# Patient Record
Sex: Female | Born: 1955 | Race: White | Hispanic: No | Marital: Married | Smoking: Never smoker
Health system: Southern US, Community
[De-identification: ages and names within clinical notes are randomized; demographics above are authoritative.]

## PROBLEM LIST (undated history)

## (undated) DIAGNOSIS — I639 Cerebral infarction, unspecified: Secondary | ICD-10-CM

## (undated) DIAGNOSIS — M1990 Unspecified osteoarthritis, unspecified site: Secondary | ICD-10-CM

## (undated) DIAGNOSIS — B269 Mumps without complication: Secondary | ICD-10-CM

## (undated) DIAGNOSIS — B019 Varicella without complication: Secondary | ICD-10-CM

## (undated) DIAGNOSIS — B059 Measles without complication: Secondary | ICD-10-CM

## (undated) DIAGNOSIS — T7840XA Allergy, unspecified, initial encounter: Secondary | ICD-10-CM

## (undated) HISTORY — DX: Mumps without complication: B26.9

## (undated) HISTORY — DX: Varicella without complication: B01.9

## (undated) HISTORY — PX: TUBAL LIGATION: SHX77

## (undated) HISTORY — DX: Allergy, unspecified, initial encounter: T78.40XA

## (undated) HISTORY — DX: Measles without complication: B05.9

---

## 2014-08-17 ENCOUNTER — Observation Stay: Payer: Self-pay | Admitting: Internal Medicine

## 2014-08-17 DIAGNOSIS — I119 Hypertensive heart disease without heart failure: Secondary | ICD-10-CM | POA: Insufficient documentation

## 2014-08-17 DIAGNOSIS — I639 Cerebral infarction, unspecified: Secondary | ICD-10-CM

## 2014-08-17 DIAGNOSIS — Z8673 Personal history of transient ischemic attack (TIA), and cerebral infarction without residual deficits: Secondary | ICD-10-CM | POA: Insufficient documentation

## 2014-08-17 HISTORY — DX: Cerebral infarction, unspecified: I63.9

## 2014-08-17 LAB — APTT: ACTIVATED PTT: 25.3 s (ref 23.6–35.9)

## 2014-08-17 LAB — URINALYSIS, COMPLETE
Bacteria: NONE SEEN
Bilirubin,UR: NEGATIVE
Blood: NEGATIVE
Glucose,UR: NEGATIVE mg/dL (ref 0–75)
Ketone: NEGATIVE
Leukocyte Esterase: NEGATIVE
NITRITE: NEGATIVE
Ph: 6 (ref 4.5–8.0)
Protein: NEGATIVE
RBC,UR: <1 /HPF (ref 0–5)
SQUAMOUS EPITHELIAL: NONE SEEN
Specific Gravity: 1.013 (ref 1.003–1.030)
WBC UR: <1 /HPF (ref 0–5)

## 2014-08-17 LAB — COMPREHENSIVE METABOLIC PANEL
ALK PHOS: 116 U/L
Albumin: 4 g/dL (ref 3.4–5.0)
Anion Gap: 7 (ref 7–16)
BUN: 15 mg/dL (ref 7–18)
Bilirubin,Total: 0.3 mg/dL (ref 0.2–1.0)
CHLORIDE: 106 mmol/L (ref 98–107)
CREATININE: 1.04 mg/dL (ref 0.60–1.30)
Calcium, Total: 9 mg/dL (ref 8.5–10.1)
Co2: 25 mmol/L (ref 21–32)
EGFR (Non-African Amer.): 59 — ABNORMAL LOW
Glucose: 131 mg/dL — ABNORMAL HIGH (ref 65–99)
Osmolality: 278 (ref 275–301)
POTASSIUM: 4 mmol/L (ref 3.5–5.1)
SGOT(AST): 25 U/L (ref 15–37)
SGPT (ALT): 35 U/L
Sodium: 138 mmol/L (ref 136–145)
TOTAL PROTEIN: 7.5 g/dL (ref 6.4–8.2)

## 2014-08-17 LAB — PROTIME-INR
INR: 0.9
Prothrombin Time: 12.3 secs (ref 11.5–14.7)

## 2014-08-17 LAB — CBC
HCT: 45.2 % (ref 35.0–47.0)
HGB: 14.8 g/dL (ref 12.0–16.0)
MCH: 28 pg (ref 26.0–34.0)
MCHC: 32.6 g/dL (ref 32.0–36.0)
MCV: 86 fL (ref 80–100)
Platelet: 253 10*3/uL (ref 150–440)
RBC: 5.28 10*6/uL — ABNORMAL HIGH (ref 3.80–5.20)
RDW: 13 % (ref 11.5–14.5)
WBC: 5.3 10*3/uL (ref 3.6–11.0)

## 2014-08-17 LAB — TROPONIN I: Troponin-I: <0.02 ng/mL

## 2014-08-17 LAB — HCG, QUANTITATIVE, PREGNANCY: Beta Hcg, Quant.: 2 m[IU]/mL

## 2014-08-18 LAB — BASIC METABOLIC PANEL
Anion Gap: 7 (ref 7–16)
BUN: 14 mg/dL (ref 7–18)
CALCIUM: 8.5 mg/dL (ref 8.5–10.1)
CHLORIDE: 109 mmol/L — AB (ref 98–107)
CREATININE: 0.87 mg/dL (ref 0.60–1.30)
Co2: 27 mmol/L (ref 21–32)
GLUCOSE: 94 mg/dL (ref 65–99)
Osmolality: 285 (ref 275–301)
Potassium: 4 mmol/L (ref 3.5–5.1)
Sodium: 143 mmol/L (ref 136–145)

## 2014-08-18 LAB — CBC WITH DIFFERENTIAL/PLATELET
BASOS ABS: 0 10*3/uL (ref 0.0–0.1)
Basophil %: 0.6 %
EOS ABS: 0.1 10*3/uL (ref 0.0–0.7)
EOS PCT: 2 %
HCT: 42.7 % (ref 35.0–47.0)
HGB: 14.4 g/dL (ref 12.0–16.0)
LYMPHS ABS: 2.2 10*3/uL (ref 1.0–3.6)
Lymphocyte %: 30.1 %
MCH: 28.6 pg (ref 26.0–34.0)
MCHC: 33.8 g/dL (ref 32.0–36.0)
MCV: 85 fL (ref 80–100)
MONOS PCT: 7.3 %
Monocyte #: 0.5 x10 3/mm (ref 0.2–0.9)
Neutrophil #: 4.5 10*3/uL (ref 1.4–6.5)
Neutrophil %: 60 %
Platelet: 265 10*3/uL (ref 150–440)
RBC: 5.04 10*6/uL (ref 3.80–5.20)
RDW: 13.4 % (ref 11.5–14.5)
WBC: 7.4 10*3/uL (ref 3.6–11.0)

## 2014-08-18 LAB — LIPID PANEL
Cholesterol: 208 mg/dL — ABNORMAL HIGH (ref 0–200)
HDL Cholesterol: 70 mg/dL — ABNORMAL HIGH (ref 40–60)
LDL CHOLESTEROL, CALC: 123 mg/dL — AB (ref 0–100)
Triglycerides: 74 mg/dL (ref 0–200)
VLDL CHOLESTEROL, CALC: 15 mg/dL (ref 5–40)

## 2014-08-18 LAB — HEMOGLOBIN A1C: Hemoglobin A1C: 5.7 % (ref 4.2–6.3)

## 2014-09-20 LAB — LIPID PANEL
Cholesterol: 197 mg/dL (ref 0–200)
HDL: 84 mg/dL — AB (ref 35–70)
LDL CALC: 96 mg/dL
Triglycerides: 83 mg/dL (ref 40–160)

## 2014-09-20 LAB — HEPATIC FUNCTION PANEL: ALT: 22 U/L (ref 7–35)

## 2014-10-23 LAB — BASIC METABOLIC PANEL
BUN: 12 mg/dL (ref 4–21)
CREATININE: 0.8 mg/dL (ref 0.5–1.1)
GLUCOSE: 102 mg/dL
POTASSIUM: 4.8 mmol/L (ref 3.4–5.3)
Sodium: 141 mmol/L (ref 137–147)

## 2014-10-23 LAB — LIPID PANEL
Cholesterol: 199 mg/dL (ref 0–200)
HDL: 76 mg/dL — AB (ref 35–70)
LDL Cholesterol: 109 mg/dL
Triglycerides: 72 mg/dL (ref 40–160)

## 2014-10-23 LAB — HEPATIC FUNCTION PANEL: ALT: 14 U/L (ref 7–35)

## 2015-03-31 NOTE — Discharge Summary (Signed)
PATIENT NAME:  Valerie LimboHILLIPS, Keily M MR#:  [id number] DATE OF BIRTH:  [birth year]  DATE OF ADMISSION:  08/17/2014 DATE OF DISCHARGE:  08/18/2014  ADMISSION DIAGNOSIS: Transient ischemic attack.   DISCHARGE DIAGNOSES:  1. 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  2. Uncontrolled hypertension.   PERTINENT LABORATORIES AT DISCHARGE:  1. White blood cells 7.4, hemoglobin 14, hematocrit 43, platelets are 265,000. Sodium 143, potassium 4.0, chloride 109, bicarbonate 27, BUN 14, creatinine 0.87, glucose is 94.  2. Hemoglobin A1c is 5.7.  3. LDL is 123, cholesterol 208.  4. 2D echocardiogram showed impaired relaxation and LV diastolic dysfunction, EF 55%-60% with mildly increased left ventricular septal thickness and left ventricular hypertrophy.  5. Carotid Doppler showed less than 50% stenosis bilaterally.  6. MRI of the brain which showed an acute white matter infarct adjacent to the left lateral ventricle and minimal periventricular and subcortical white matter changes predominantly on the left.   HOSPITAL COURSE: A 59 year old female with uncontrolled hypertension not taking medications at home, who presented with right-sided weakness, found to have an acute stroke by MRI. For further details, please refer to H and P.     1. Acute CVA of the left ventricle. I suspect this is due to uncontrolled hypertension. She has microvascular changes on MRI. I do not think this is embolic in nature. Her symptoms have resolved. No evidence of emboli on echocardiogram, her carotid Dopplers were less than 50% stenosis. She will continue on aspirin and statin and have neurology followup in two weeks.  2. Hypertension, long-standing issue, uncontrolled as per the family.  She was started on metoprolol. She will need close followup as an outpatient. Since this is an acute stroke she may have some permissive hypertension for a few days after acute symptoms.   DISCHARGE MEDICATIONS:  1. Aspirin  81 mg daily.  2. Metoprolol 25  b.i.d.  3. Simvastatin 20 mg at bedtime.   DISCHARGE DIET: Low sodium, low fat, low cholesterol.   DISCHARGE ACTIVITY: As tolerated. No heavy lifting.    DISCHARGE FOLLOWUP:  The patient will find a primary care physician in the area and followup  is strongly encouraged and discussed with her and her family.   TIME SPENT: Approximately 35 minutes.   The patient is stable for discharge.    ____________________________ Demba Nigh P. Juliene PinaMody, MD spm:bu D: 08/18/2014 12:09:58 ET T: 08/18/2014 13:03:10 ET JOB#: [id number]  cc: Mordche Hedglin P. Juliene PinaMody, MD, <Dictator> Janyth ContesSITAL P Mario Coronado MD ELECTRONICALLY SIGNED 08/18/2014 15:43

## 2015-03-31 NOTE — H&P (Signed)
PATIENT NAME:  Valerie Henry, Valerie Henry MR#:  [id number] DATE OF BIRTH:  Dec 08, 1956  DATE OF ADMISSION:  08/17/2014  PRIMARY DOCTOR: From [facility].  EMERGENCY ROOM PHYSICIAN: Eryka A. Inocencio Homes, MD  CHIEF COMPLAINT: Right-sided weakness.  HISTORY OF PRESENT ILLNESS: A 59 year old female patient with no past medical problems, brought in because of sudden onset of right-sided weakness. The patient finished workout with her friend, then she noticed she was very weak on the right side. She did not have any power on the right side, and then she also felt numb. All the symptoms lasted for 1/2 hour.  sudden onset of right-sided weakness, started around 8:00 a.m., lasted for 30 minutes. The patient called EMS around 8:15. By the time she came to the ER, all her symptoms resolved. The patient found to have elevated blood pressure at 200/110, and it stayed persistently high, and during my visit, blood pressure is high at 181/112. The patient denies any complaints. She says she was slightly blurred when the weakness started on the right side. She noted blurring of the vision on the right side (>. All the symptoms resolved.  now.Denies any complaints now. CT head unremarkable, and her lab data unremarkable. The patient denies any dysphagia. Denies any dysarthria. Denies any trouble with ambulation, and the patient also denies any headache. We are admitting her for TIA evaluation.   PAST MEDICAL HISTORY: No hypertension or diabetes.   ALLERGIES: No known allergies.   SOCIAL HISTORY: No smoking. No drinking. No drugs. The patient works in a [employer].   PAST SURGICAL HISTORY: No surgeries.   FAMILY HISTORY: No hypertension or diabetes.   MEDICATIONS: None.   REVIEW OF SYSTEMS:  CONSTITUTIONAL: No fever or fatigue.  EYES: No blurred vision at this time.  ENT: No tinnitus. No epistaxis. No difficulty swallowing.  RESPIRATION: No cough. No wheezing.  CARDIOVASCULAR: No chest pain, orthopnea. No  PND.  GASTROINTESTINAL: No nausea. No vomiting. No abdominal pain.  GENITOURINARY: No dysuria.  ENDOCRINE: No polyuria or polydipsia.  HEMATOLOGICAL: No anemia.  INTEGUMENTARY: No skin rash.  MUSCULOSKELETAL: No joint pain.  NEUROLOGICAL: The patient had numbness on the right side, but resolved now. Denies any weakness on the right or left side now. Denies any dysarthria. The patient did not have any tremors. Denies any ataxia. Denies any headache at this time. Denies memory loss. PSYCHIATRIC: No anxiety or insomnia.   PHYSICAL EXAMINATION:  VITAL SIGNS: Temperature 97.7, heart rate 106, blood pressure 197/103, saturation is 100% on room air. GENERAL: Alert, awake, oriented. A well-nourished female not in distress. Answering questions appropriately.  HEENT: Head: Atraumatic, normocephalic. Eyes: PERRLA. EOM intact. No conjunctival pallor. Nose: No nasal lesions. No drainage. Ears: No drainage. No external lesions. Mouth: No lesions. No exudates.  NECK: Supple. No JVD. No carotid bruit. Normal range of motion.  RESPIRATION: Good respiratory effort. Clear to auscultation. No wheeze. No rales.  CARDIOVASCULAR: S1, S2 regular. No murmurs. No gallops. No peripheral edema. Pulses are equal in carotid and femoral artery and dorsalis pedis.  GASTROINTESTINAL: Abdomen is soft, nontender, nondistended. Bowel sounds present.  EXTREMITIES: No extremity edema. No cyanosis. No clubbing.  NEUROLOGIC: The patient is alert, awake, oriented. Cranial nerves II through XII intact. DTRs are symmetric, 2+ bilaterally. Power 4/4 upper and lower extremities. The patient's sensations are intact. Straight-leg raising test is negative bilaterally.  PSYCHIATRIC: Mood and affect are within normal limits.   LABORATORY DATA:  Chest x-ray shows negative exam.  CT head shows  no acute abnormality. Soft tissue density in the right external auditory canal possibly cerumen.  WBC 5.3, hemoglobin 14.8, hematocrit 45.2, platelet  253.  Electrolytes: Sodium is 138, potassium 4, chloride 106, bicarbonate 25, BUN 15, creatinine 1, glucose 131.  Troponin less than 0.02.  EKG: Normal sinus rhythm, 99 beats per minute. No ST-T changes.   ASSESSMENT AND PLAN:  1. This is a 59 year old female with sudden onset of right-sided weakness, which resolved. Will need to rule out transient ischemic attack. Admit her to the hospitalist service on observation status. Start her on aspirin and check MRI of the brain, carotid ultrasound, echocardiogram and also neuro checks.  2. Malignant hypertension. The patient's blood pressure was very high when she came. Will start her on beta blockers and check fasting lipids and continue to monitor on telemetry for any arrhythmias and see how she does.  3. Follow the MRI, carotid ultrasound and echocardiogram and continue her aspirin and see how she does.   TIME SPENT: About 55 minutes.   Discussed this plan with the patient's family.   ____________________________ Katha HammingSnehalatha Carlie Solorzano, MD sk:lb D: 08/17/2014 11:12:17 ET T: 08/17/2014 11:44:24 ET JOB#: [id number]  cc: Katha HammingSnehalatha Ciarah Peace, MD, <Dictator> Katha HammingSNEHALATHA Ysabella Babiarz MD ELECTRONICALLY SIGNED 09/05/2014 18:26

## 2015-04-12 ENCOUNTER — Encounter: Payer: Self-pay | Admitting: *Deleted

## 2015-04-12 DIAGNOSIS — I10 Essential (primary) hypertension: Secondary | ICD-10-CM | POA: Insufficient documentation

## 2015-04-12 DIAGNOSIS — J302 Other seasonal allergic rhinitis: Secondary | ICD-10-CM | POA: Insufficient documentation

## 2015-04-12 DIAGNOSIS — E785 Hyperlipidemia, unspecified: Secondary | ICD-10-CM | POA: Insufficient documentation

## 2015-05-28 ENCOUNTER — Encounter: Payer: Self-pay | Admitting: Family Medicine

## 2015-05-28 ENCOUNTER — Telehealth: Payer: Self-pay | Admitting: Family Medicine

## 2015-05-28 ENCOUNTER — Other Ambulatory Visit: Payer: Self-pay | Admitting: *Deleted

## 2015-05-28 ENCOUNTER — Ambulatory Visit (INDEPENDENT_AMBULATORY_CARE_PROVIDER_SITE_OTHER): Payer: Managed Care, Other (non HMO) | Admitting: Family Medicine

## 2015-05-28 VITALS — BP 150/90 | HR 62 | Temp 97.8°F | Resp 16 | Ht 63.75 in | Wt 153.6 lb

## 2015-05-28 DIAGNOSIS — Z8673 Personal history of transient ischemic attack (TIA), and cerebral infarction without residual deficits: Secondary | ICD-10-CM

## 2015-05-28 DIAGNOSIS — E785 Hyperlipidemia, unspecified: Secondary | ICD-10-CM | POA: Diagnosis not present

## 2015-05-28 DIAGNOSIS — Z23 Encounter for immunization: Secondary | ICD-10-CM

## 2015-05-28 DIAGNOSIS — I10 Essential (primary) hypertension: Secondary | ICD-10-CM

## 2015-05-28 DIAGNOSIS — I119 Hypertensive heart disease without heart failure: Secondary | ICD-10-CM | POA: Diagnosis not present

## 2015-05-28 DIAGNOSIS — Z01419 Encounter for gynecological examination (general) (routine) without abnormal findings: Secondary | ICD-10-CM | POA: Diagnosis not present

## 2015-05-28 MED ORDER — SIMVASTATIN 40 MG PO TABS: 40.0000 mg | ORAL_TABLET | Freq: Every day | ORAL | Status: DC

## 2015-05-28 MED ORDER — METOPROLOL SUCCINATE ER 50 MG PO TB24: 50.0000 mg | ORAL_TABLET | Freq: Every day | ORAL | Status: DC

## 2015-05-28 MED ORDER — LISINOPRIL-HYDROCHLOROTHIAZIDE 10-12.5 MG PO TABS: 1.0000 | ORAL_TABLET | Freq: Every day | ORAL | Status: DC

## 2015-05-28 NOTE — Telephone Encounter (Signed)
Pt stated she spoke with Marcelino Duster and Dr. Sherrie Mustache at her appt today and was told we would accept her daughter as a new pt. Name: Valerie Henry. DOB: [birth year]. Insurance: BC[employer]. No current meds or conditions. Jessica's CB#[phone]. Can Dr. Sherrie Mustache accept new pt if so, when? Thanks TNP

## 2015-05-28 NOTE — Telephone Encounter (Signed)
Patient had her rx's filled 03/12/2015. However, pt would like her meds to be 90 day supply. Please advise?

## 2015-05-28 NOTE — Progress Notes (Signed)
Patient: Valerie Henry, Female    DOB: 25-Feb-1956, 59 y.o.   MRN: [id number] Visit Date: 05/28/2015  Today's Provider: Mila Merry, MD   Chief Complaint  Patient presents with  . Annual Exam   Subjective:    Annual physical exam Valerie Henry is a 59 y.o. female who presents today for health maintenance and complete physical. She feels well. She reports exercising cardio and weight lifting. She reports she is sleeping well.  -----------------------------------------------------------------    Hypertension, follow-up:  BP Readings from Last 3 Encounters:  05/28/15 150/90  04/05/15 148/82    She was last seen for hypertension 3 months ago.  BP at that visit was 136/80. Management changes since that visit include none .She reports good compliance with treatment. She is not having side effects. none  She is not exercising. She is adherent to low salt diet.   Outside blood pressures are 120/80. She is experiencing none.  Patient denies none.   Cardiovascular risk factors include none.  Use of agents associated with hypertension: none.  Sh states she checks BP regularly at home and is consistently in the 120s/80s  ------------------------------------------------------------------------    Lipid/Cholesterol, Follow-up:   Last seen for this 3 months ago.  Management changes since that visit include none.  Last Lipid Panel:    Component Value Date/Time   CHOL 199 10/23/2014   TRIG 72 10/23/2014   HDL 76* 10/23/2014   LDLCALC 109 10/23/2014    She reports good compliance with treatment. She is not having side effects. none  Wt Readings from Last 3 Encounters:  05/28/15 153 lb 9.6 oz (69.673 kg)  04/05/15 152 lb (68.947 kg)    ------------------------------------------------------------------------   Review of Systems  Constitutional: Negative for fever, chills and fatigue.  HENT: Positive for congestion and rhinorrhea. Negative for ear pain, sneezing  and sore throat.   Eyes: Negative.  Negative for pain and redness.  Respiratory: Negative for cough, shortness of breath and wheezing.   Cardiovascular: Negative for chest pain and leg swelling.  Gastrointestinal: Negative for nausea, abdominal pain, diarrhea, constipation and blood in stool.  Endocrine: Negative for polydipsia and polyphagia.  Genitourinary: Negative.  Negative for dysuria, hematuria, flank pain, vaginal bleeding, vaginal discharge and pelvic pain.  Musculoskeletal: Negative for back pain, joint swelling, arthralgias and gait problem.       Joint Stiffness, thumbs  Skin: Negative for rash.  Allergic/Immunologic: Positive for environmental allergies and food allergies.  Neurological: Negative.  Negative for dizziness, tremors, seizures, weakness, light-headedness, numbness and headaches.  Hematological: Negative for adenopathy.  Psychiatric/Behavioral: Negative.  Negative for behavioral problems, confusion and dysphoric mood. The patient is not nervous/anxious and is not hyperactive.     Social History She  reports that she has never smoked. She does not have any smokeless tobacco history on file. She reports that she drinks about 2.4 oz of alcohol per week. She reports that she does not use illicit drugs.  Patient Active Problem List   Diagnosis Date Noted  . Allergic rhinitis, seasonal 04/12/2015  . Essential (primary) hypertension 04/12/2015  . HLD (hyperlipidemia) 04/12/2015  . Cerebrovascular accident, old 08/17/2014  . Hypertensive left ventricular hypertrophy 08/17/2014    Past Surgical History  Procedure Laterality Date  . Tubal ligation  80's    Family History Her family history includes Dementia in her father; Heart attack in her father and paternal uncle; Ovarian cancer in her mother.    Previous Medications   ASPIRIN 81 MG TABLET  Take 1 tablet by mouth daily.   FLUTICASONE (FLONASE) 50 MCG/ACT NASAL SPRAY    Place 2 sprays into the nose daily.    LISINOPRIL-HYDROCHLOROTHIAZIDE (PRINZIDE,ZESTORETIC) 10-12.5 MG PER TABLET    Take 1 tablet by mouth daily.   METOPROLOL SUCCINATE (TOPROL-XL) 50 MG 24 HR TABLET    Take 1 tablet by mouth daily.   SIMVASTATIN (ZOCOR) 40 MG TABLET    Take 1 tablet by mouth at bedtime.    Patient Care Team: Malva Limes, MD as PCP - General (Family Medicine)     Objective:   Vitals: BP 150/90 mmHg  Pulse 62  Temp(Src) 97.8 F (36.6 C) (Oral)  Resp 16  Ht 5' 3.75" (1.619 m)  Wt 153 lb 9.6 oz (69.673 kg)  BMI 26.58 kg/m2   Physical Exam  BP 150/90 mmHg  Pulse 62  Temp(Src) 97.8 F (36.6 C) (Oral)  Resp 16  Ht 5' 3.75" (1.619 m)  Wt 153 lb 9.6 oz (69.673 kg)  BMI 26.58 kg/m2  General Appearance:    Alert, cooperative, no distress, appears stated age  Head:    Normocephalic, without obvious abnormality, atraumatic  Eyes:    PERRL, conjunctiva/corneas clear, EOM's intact, fundi    benign, both eyes  Ears:    Normal TM's and external ear canals, both ears  Nose:   Nares normal, septum midline, mucosa normal, no drainage    or sinus tenderness  Throat:   Lips, mucosa, and tongue normal; teeth and gums normal  Neck:   Supple, symmetrical, trachea midline, no adenopathy;    thyroid:  no enlargement/tenderness/nodules; no carotid   bruit or JVD  Back:     Symmetric, no curvature, ROM normal, no CVA tenderness  Lungs:     Clear to auscultation bilaterally, respirations unlabored  Chest Wall:    No tenderness or deformity   Heart:    Regular rate and rhythm, S1 and S2 normal, no murmur, rub   or gallop  Breast Exam:    No tenderness, masses, or nipple abnormality  Abdomen:     Soft, non-tender, bowel sounds active all four quadrants,    no masses, no organomegaly  Genitalia:    Normal female without lesion, discharge or tenderness     Extremities:   Extremities normal, atraumatic, no cyanosis or edema  Pulses:   2+ and symmetric all extremities  Skin:   Skin color, texture, turgor normal,  no rashes or lesions  Lymph nodes:   Cervical, supraclavicular, and axillary nodes normal  Neurologic:   CNII-XII intact, normal strength, sensation and reflexes    throughout   Depression Screen PHQ 2/9 Scores 05/28/2015  PHQ - 2 Score 0    <ECGINTERP> EKG: normal EKG, normal sinus rhythm.    Assessment & Plan:     Routine Health Maintenance and Physical Exam  Exercise Activities and Dietary recommendations Goals    None       There is no immunization history on file for this patient.  Health Maintenance  Topic Date Due  . HIV Screening  02/04/1971  . PAP SMEAR  02/04/1974  . TETANUS/TDAP  02/04/1975  . MAMMOGRAM  02/04/2006  . COLONOSCOPY  02/04/2006  . INFLUENZA VACCINE  07/09/2015      Discussed health benefits of physical activity, and encouraged her to engage in regular exercise appropriate for her age and condition.    --------------------------------------------------------------------  1. Well woman exam with routine gynecological exam Doing very well with normal exam today -  Pap IG and HPV (high risk) DNA detection  2. Hypertensive left ventricular hypertrophy, without heart failure BP well controlled outside of office. Continue current medications.  EKG normal since BP now controlled.  - EKG 12-Lead  3. Essential (primary) hypertension  - Renal function panel  4. HLD (hyperlipidemia) She is tolerating atorvastatin well with no adverse effects.   - Lipid panel  5. Cerebrovascular accident, old No residual neuro deficits.   6. Need for Tdap vaccination  - Tdap vaccine greater than or equal to 7yo IM  7. Allergic rhinitis.  - Off of all oral decongestants and well controlled on nasal steroid. Advised to to stop periodically reduce risk of atrophy of septum.

## 2015-05-29 ENCOUNTER — Telehealth: Payer: Self-pay

## 2015-05-29 LAB — RENAL FUNCTION PANEL
Albumin: 4.6 g/dL (ref 3.5–5.5)
BUN / CREAT RATIO: 18 (ref 9–23)
BUN: 14 mg/dL (ref 6–24)
CALCIUM: 9.5 mg/dL (ref 8.7–10.2)
CHLORIDE: 98 mmol/L (ref 97–108)
CO2: 23 mmol/L (ref 18–29)
CREATININE: 0.78 mg/dL (ref 0.57–1.00)
GFR calc Af Amer: 96 mL/min/{1.73_m2} (ref >59)
GFR calc non Af Amer: 83 mL/min/{1.73_m2} (ref >59)
GLUCOSE: 92 mg/dL (ref 65–99)
PHOSPHORUS: 4 mg/dL (ref 2.5–4.5)
Potassium: 4.2 mmol/L (ref 3.5–5.2)
Sodium: 140 mmol/L (ref 134–144)

## 2015-05-29 LAB — LIPID PANEL
Chol/HDL Ratio: 2.1 ratio units (ref 0.0–4.4)
Cholesterol, Total: 202 mg/dL — ABNORMAL HIGH (ref 100–199)
HDL: 95 mg/dL (ref >39)
LDL Calculated: 92 mg/dL (ref 0–99)
Triglycerides: 76 mg/dL (ref 0–149)
VLDL CHOLESTEROL CAL: 15 mg/dL (ref 5–40)

## 2015-05-29 NOTE — Telephone Encounter (Signed)
Pt advised as directed below.   Thanks,   -Illianna Paschal  

## 2015-05-29 NOTE — Telephone Encounter (Signed)
LMTCB Valerie Henry, CMA  

## 2015-05-29 NOTE — Telephone Encounter (Signed)
-----   Message from Malva Limes, MD sent at 05/29/2015  7:10 AM EDT ----- Cholesterol is very good, LDL is 92 and we want it to be under 100. HDL chol is high at 95, which is good. Blood sugar and kidney functions are normal. Continue current medications.  Check labs yearly.

## 2015-05-29 NOTE — Telephone Encounter (Signed)
Spoke with Pt's daughter Shanda Bumps and advised we could accept as new pt and scheduled for Thursday 05/31/15. Thanks TNP

## 2015-05-29 NOTE — Telephone Encounter (Signed)
Ok to accept as new patient.

## 2015-05-30 LAB — PAP IG AND HPV HIGH-RISK
HPV, HIGH-RISK: NEGATIVE
PAP SMEAR COMMENT: 0

## 2015-07-09 ENCOUNTER — Ambulatory Visit (INDEPENDENT_AMBULATORY_CARE_PROVIDER_SITE_OTHER): Payer: Managed Care, Other (non HMO) | Admitting: Family Medicine

## 2015-07-09 ENCOUNTER — Encounter: Payer: Self-pay | Admitting: Family Medicine

## 2015-07-09 VITALS — BP 152/94 | HR 58 | Temp 98.9°F | Resp 16 | Wt 156.0 lb

## 2015-07-09 DIAGNOSIS — H6691 Otitis media, unspecified, right ear: Secondary | ICD-10-CM | POA: Diagnosis not present

## 2015-07-09 DIAGNOSIS — H6690 Otitis media, unspecified, unspecified ear: Secondary | ICD-10-CM | POA: Insufficient documentation

## 2015-07-09 MED ORDER — AZITHROMYCIN 250 MG PO TABS: ORAL_TABLET | ORAL | Status: DC

## 2015-07-09 MED ORDER — SIMVASTATIN 40 MG PO TABS: 40.0000 mg | ORAL_TABLET | Freq: Every day | ORAL | Status: DC

## 2015-07-09 MED ORDER — METOPROLOL SUCCINATE ER 50 MG PO TB24: 50.0000 mg | ORAL_TABLET | Freq: Every day | ORAL | Status: DC

## 2015-07-09 NOTE — Progress Notes (Signed)
       Patient: Valerie Henry Female    DOB: [birth year]   59 y.o.   MRN: [id number] Visit Date: 07/09/2015  Today's Provider: Mila Merry, MD   Chief Complaint  Patient presents with  . Otalgia   Subjective:    Otalgia  There is pain in the right ear. This is a recurrent problem. The current episode started in the past 7 days. The problem occurs constantly. The problem has been gradually worsening. There has been no fever. The pain is mild. Associated symptoms include ear discharge and headaches. Pertinent negatives include no coughing or sore throat. She has tried NSAIDs for the symptoms. The treatment provided mild relief.   She states that symptoms are similar to episode in Valerie Henry when she was treated successfully with azithromyin    No Known Allergies Previous Medications   ASPIRIN 81 MG TABLET    Take 1 tablet by mouth daily.   FLUTICASONE (FLONASE) 50 MCG/ACT NASAL SPRAY    Place 2 sprays into the nose daily.   LISINOPRIL-HYDROCHLOROTHIAZIDE (PRINZIDE,ZESTORETIC) 10-12.5 MG PER TABLET    Take 1 tablet by mouth daily.   METOPROLOL SUCCINATE (TOPROL-XL) 50 MG 24 HR TABLET    Take 1 tablet (50 mg total) by mouth daily.   SIMVASTATIN (ZOCOR) 40 MG TABLET    Take 1 tablet (40 mg total) by mouth at bedtime.    Review of Systems  HENT: Positive for ear discharge and ear pain. Negative for sore throat.   Respiratory: Negative for cough.   Cardiovascular: Negative for chest pain and palpitations.  Neurological: Positive for headaches.    History  Substance Use Topics  . Smoking status: Never Smoker   . Smokeless tobacco: Not on file  . Alcohol Use: 2.4 oz/week    4 Standard drinks or equivalent per week   Objective:   BP 152/94 mmHg  Pulse 58  Temp(Src) 98.9 F (37.2 C) (Oral)  Resp 16  Wt 156 lb (70.761 kg)  SpO2 97%  Physical Exam General Appearance:    Alert, cooperative, no distress  HENT:   right TM fluid noted, neck without nodes, throat normal without  erythema or exudate and sinuses nontender  Eyes:    PERRL, conjunctiva/corneas clear, EOM's intact       Lungs:     Clear to auscultation bilaterally, respirations unlabored  Heart:    Regular rate and rhythm  Neurologic:   Awake, alert, oriented x 3. No apparent focal neurological           defect.             Assessment & Plan:     1. Right otitis media, recurrence not specified, unspecified chronicity, unspecified otitis media type  - azithromycin (ZITHROMAX) 250 MG tablet; 2 by mouth today, then 1 daily for 4 days  Dispense: 6 tablet; Refill: 0     Call if symptoms change or if not rapidly improving.      Mila Merry, MD  [facility]

## 2015-07-13 ENCOUNTER — Telehealth: Payer: Self-pay | Admitting: Family Medicine

## 2015-07-13 DIAGNOSIS — H6691 Otitis media, unspecified, right ear: Secondary | ICD-10-CM

## 2015-07-13 MED ORDER — AZITHROMYCIN 250 MG PO TABS: ORAL_TABLET | ORAL | Status: AC

## 2015-07-13 NOTE — Telephone Encounter (Signed)
Patient completed her azithromycin (ZITHROMAX) 250 MG tablet today and her ear is still bothering her. She is requesting a refill to be sent to [facility] on [address].  KB

## 2015-07-19 ENCOUNTER — Telehealth: Payer: Self-pay | Admitting: Family Medicine

## 2015-07-19 NOTE — Telephone Encounter (Signed)
Please advise. Thanks.  

## 2015-07-19 NOTE — Telephone Encounter (Signed)
Recommend she change antibiotic to Augmentin,  one twice a day for 7 days. If not cleared up over the weekend she needs to call back for ENT referral.

## 2015-07-19 NOTE — Telephone Encounter (Signed)
Pt stated that her right ear is still stopped up and she can not hear in that ear. Pt stated she has been on 2 rounds of medication and she finished the last round today. Pt wanted to know if she should try something else or come in for OV. Pharmacy: [facility]. Thanks TNP

## 2015-07-19 NOTE — Telephone Encounter (Signed)
Called in Augmentin as below. Patient advised. Patient report that she will call if not better over the weekend.

## 2015-07-23 ENCOUNTER — Telehealth: Payer: Self-pay | Admitting: Family Medicine

## 2015-07-23 DIAGNOSIS — H9209 Otalgia, unspecified ear: Secondary | ICD-10-CM | POA: Insufficient documentation

## 2015-07-23 NOTE — Telephone Encounter (Signed)
Patient notified

## 2015-07-23 NOTE — Telephone Encounter (Signed)
Pt states she has had an ear infection for 4 weeks.  Pt is asking if she should get a referral to see a specialist.  [phone]

## 2015-07-23 NOTE — Telephone Encounter (Signed)
Please advise 

## 2015-07-23 NOTE — Telephone Encounter (Signed)
She needs to see ENT. Have sent message to Maralyn Sago for referral. Please advise patient.

## 2016-05-30 ENCOUNTER — Encounter: Payer: Self-pay | Admitting: Family Medicine

## 2016-05-30 ENCOUNTER — Ambulatory Visit (INDEPENDENT_AMBULATORY_CARE_PROVIDER_SITE_OTHER): Payer: Managed Care, Other (non HMO) | Admitting: Family Medicine

## 2016-05-30 ENCOUNTER — Other Ambulatory Visit: Payer: Self-pay | Admitting: Family Medicine

## 2016-05-30 VITALS — BP 140/80 | HR 68 | Temp 98.3°F | Resp 16 | Ht 63.75 in | Wt 164.0 lb

## 2016-05-30 DIAGNOSIS — E785 Hyperlipidemia, unspecified: Secondary | ICD-10-CM

## 2016-05-30 DIAGNOSIS — S86911S Strain of unspecified muscle(s) and tendon(s) at lower leg level, right leg, sequela: Secondary | ICD-10-CM

## 2016-05-30 DIAGNOSIS — I10 Essential (primary) hypertension: Secondary | ICD-10-CM

## 2016-05-30 DIAGNOSIS — S86911A Strain of unspecified muscle(s) and tendon(s) at lower leg level, right leg, initial encounter: Secondary | ICD-10-CM | POA: Insufficient documentation

## 2016-05-30 DIAGNOSIS — Z0000 Encounter for general adult medical examination without abnormal findings: Secondary | ICD-10-CM | POA: Diagnosis not present

## 2016-05-30 DIAGNOSIS — Z8673 Personal history of transient ischemic attack (TIA), and cerebral infarction without residual deficits: Secondary | ICD-10-CM

## 2016-05-30 DIAGNOSIS — Z1211 Encounter for screening for malignant neoplasm of colon: Secondary | ICD-10-CM | POA: Diagnosis not present

## 2016-05-30 NOTE — Progress Notes (Signed)
Patient: Valerie Henry, Female    DOB: 15-Aug-1956, 60 y.o.   MRN: [id number] Visit Date: 05/30/2016  Today's Provider: Mila Merry, MD   Chief Complaint  Patient presents with  . Annual Exam  . Hypertension  . Hyperlipidemia   Subjective:    Annual physical exam Valerie Henry is a 60 y.o. female who presents today for health maintenance and complete physical. She feels well. She reports exercising /yes. She reports she is sleeping poorly.  -----------------------------------------------------------------    Hypertension, follow-up:  BP Readings from Last 3 Encounters:  05/30/16 140/80  07/09/15 152/94  05/28/15 150/90    She was last seen for hypertension 1 years ago.  BP at that visit was 150/90. Management since that visit includes; no changes.She reports good compliance with treatment. She is not having side effects. none  She is exercising. She is adherent to low salt diet.   Outside blood pressures are 123/83. She is experiencing none.  Patient denies none.   Cardiovascular risk factors include none.  Use of agents associated with hypertension: none.   ----------------------------------------------------------------------   Lipid/Cholesterol, Follow-up:   Last seen for this 1 years ago.  Management since that visit includes; no changes.  Last Lipid Panel:    Component Value Date/Time   CHOL 202* 05/28/2015 1101   CHOL 199 10/23/2014   CHOL 208* 08/18/2014 0417   TRIG 76 05/28/2015 1101   TRIG 74 08/18/2014 0417   HDL 95 05/28/2015 1101   HDL 76* 10/23/2014   HDL 70* 08/18/2014 0417   CHOLHDL 2.1 05/28/2015 1101   VLDL 15 08/18/2014 0417   LDLCALC 92 05/28/2015 1101   LDLCALC 109 10/23/2014   LDLCALC 123* 08/18/2014 0417    She reports good compliance with treatment. She is not having side effects. none  Wt Readings from Last 3 Encounters:  05/30/16 164 lb (74.39 kg)  07/09/15 156 lb (70.761 kg)  05/28/15 153 lb 9.6 oz  (69.673 kg)    ----------------------------------------------------------------------  Allergies have been well controlled on cetirizine. Is only using fluticasone nasal spray intermittently, but has been having feeling of congestion and fluid in right ear the last few weeks.     Review of Systems  Constitutional: Negative for fever, chills, diaphoresis, activity change, appetite change, fatigue and unexpected weight change.  HENT: Positive for ear pain. Negative for congestion, dental problem, drooling, ear discharge, facial swelling, hearing loss, mouth sores, nosebleeds, postnasal drip, rhinorrhea, sinus pressure, sneezing, sore throat, tinnitus, trouble swallowing and voice change.   Eyes: Negative.   Respiratory: Negative.   Cardiovascular: Negative.   Gastrointestinal: Negative.   Endocrine: Negative.   Genitourinary: Negative.   Musculoskeletal: Positive for joint swelling and arthralgias.       Injured right knee in February while exercising. Has been seeing a chiropractor since but knee still stays swollen and stiff.   Skin: Negative.   Allergic/Immunologic: Negative.   Neurological: Negative.   Hematological: Negative.   Psychiatric/Behavioral: Negative.     Social History      She  reports that she has never smoked. She does not have any smokeless tobacco history on file. She reports that she drinks about 2.4 oz of alcohol per week. She reports that she does not use illicit drugs.       Social History   Social History  . Marital Status: Married    Spouse Name: N/A  . Number of Children: 2  . Years of Education: HS Valerie Henry  Social History Main Topics  . Smoking status: Never Smoker   . Smokeless tobacco: None  . Alcohol Use: 2.4 oz/week    4 Standard drinks or equivalent per week  . Drug Use: No  . Sexual Activity: Not Asked   Other Topics Concern  . None   Social History Narrative    Past Medical History  Diagnosis Date  . Allergy     Seasonal  .  History of CVA (cerebrovascular accident) 08/17/2014    chickem pox  . Chicken pox   . Measles   . Mumps      Patient Active Problem List   Diagnosis Date Noted  . Ear pain 07/23/2015  . Otitis media 07/09/2015  . Allergic rhinitis, seasonal 04/12/2015  . Essential (primary) hypertension 04/12/2015  . HLD (hyperlipidemia) 04/12/2015  . Cerebrovascular accident, old 08/17/2014    Past Surgical History  Procedure Laterality Date  . Tubal ligation  80's    Family History        Family Status  Relation Status Death Age  . Mother Deceased     Ovarian cancer  . Father Deceased [id number]    MI  . Brother Alive     In good health  . Daughter Alive     In good health  . Son Alive     In good health, adopted  . Paternal Uncle Alive         Her family history includes Dementia in her father; Heart attack in her father and paternal uncle; Ovarian cancer in her mother.    No Known Allergies  Current Meds  Medication Sig  . aspirin 81 MG tablet Take 1 tablet by mouth daily.  . cetirizine (ZYRTEC) 10 MG tablet Take 10 mg by mouth daily.  . fluticasone (FLONASE) 50 MCG/ACT nasal spray Place 2 sprays into the nose daily.  Marland Kitchen. lisinopril-hydrochlorothiazide (PRINZIDE,ZESTORETIC) 10-12.5 MG per tablet Take 1 tablet by mouth daily.  . metoprolol succinate (TOPROL-XL) 50 MG 24 hr tablet Take 1 tablet (50 mg total) by mouth daily.  . simvastatin (ZOCOR) 40 MG tablet Take 1 tablet (40 mg total) by mouth at bedtime.    Patient Care Team: Malva Limesonald E Fisher, MD as PCP - General (Family Medicine)     Objective:   Vitals: BP 140/80 mmHg  Pulse 68  Temp(Src) 98.3 F (36.8 C) (Oral)  Resp 16  Ht 5' 3.75" (1.619 m)  Wt 164 lb (74.39 kg)  BMI 28.38 kg/m2  SpO2 96%   Physical Exam   General Appearance:    Alert, cooperative, no distress, appears stated age  Head:    Normocephalic, without obvious abnormality, atraumatic  Eyes:    PERRL, conjunctiva/corneas clear, EOM's intact, fundi     benign, both eyes  Ears:    Normal TM's and external ear canals, both ears  Nose:   Nares normal, septum midline, mucosa normal, no drainage    or sinus tenderness  Throat:   Lips, mucosa, and tongue normal; teeth and gums normal  Neck:   Supple, symmetrical, trachea midline, no adenopathy;    thyroid:  no enlargement/tenderness/nodules; no carotid   bruit or JVD  Back:     Symmetric, no curvature, ROM normal, no CVA tenderness  Lungs:     Clear to auscultation bilaterally, respirations unlabored  Chest Wall:    No tenderness or deformity   Heart:    Regular rate and rhythm, S1 and S2 normal, no murmur, rub   or gallop  Breast Exam:    normal appearance, no masses or tenderness  Abdomen:     Soft, non-tender, bowel sounds active all four quadrants,    no masses, no organomegaly  Pelvic:    deferred  Extremities:   Extremities normal, atraumatic, no cyanosis or edema  Pulses:   2+ and symmetric all extremities  Skin:   Skin color, texture, turgor normal, no rashes or lesions  Lymph nodes:   Cervical, supraclavicular, and axillary nodes normal  Neurologic:   CNII-XII intact, normal strength, sensation and reflexes    throughout    Depression Screen PHQ 2/9 Scores 05/30/2016 05/28/2015  PHQ - 2 Score 0 0  PHQ- 9 Score 2 -      Assessment & Plan:     Routine Health Maintenance and Physical Exam  Exercise Activities and Dietary recommendations Goals    None      Immunization History  Administered Date(s) Administered  . Tdap 05/28/2015  . Zoster 05/24/2016    Health Maintenance  Topic Date Due  . Hepatitis C Screening  November 03, 1956  . HIV Screening  02/04/1971  . MAMMOGRAM  02/04/2006  . COLONOSCOPY  02/04/2006  . INFLUENZA VACCINE  07/08/2016  . PAP SMEAR  05/27/2018  . TETANUS/TDAP  05/27/2025  . ZOSTAVAX  Completed      Discussed health benefits of physical activity, and encouraged her to engage in regular exercise appropriate for her age and condition.      --------------------------------------------------------------------  1. Annual physical exam Generally doing well. Contact information to call [facility] for mammogram - Comprehensive metabolic panel - Lipid panel  2. HLD (hyperlipidemia) She is tolerating simvastatin well with no adverse effects.   - Lipid panel - TSH  3. Essential (primary) hypertension Well controlled.  Continue current medications.   - EKG 12-Lead  4. Cerebrovascular accident, old asymptomatic  5. Colon cancer screening  - Ambulatory referral to Gastroenterology  6. Strain of right knee, sequela x 4 months Try OTC elastic knee brace x 2 weeks. Call back for orthopedics referral if not effective.    Mila Merryonald Fisher, MD  [facility]

## 2016-05-31 LAB — TSH: TSH: 1.69 u[IU]/mL (ref 0.450–4.500)

## 2016-05-31 LAB — LIPID PANEL
Chol/HDL Ratio: 2.1 ratio units (ref 0.0–4.4)
Cholesterol, Total: 171 mg/dL (ref 100–199)
HDL: 81 mg/dL (ref >39)
LDL Calculated: 76 mg/dL (ref 0–99)
TRIGLYCERIDES: 72 mg/dL (ref 0–149)
VLDL Cholesterol Cal: 14 mg/dL (ref 5–40)

## 2016-06-01 LAB — COMPREHENSIVE METABOLIC PANEL
A/G RATIO: 1.8 (ref 1.2–2.2)
ALK PHOS: 93 IU/L (ref 39–117)
ALT: 23 IU/L (ref 0–32)
AST: 23 IU/L (ref 0–40)
Albumin: 4.2 g/dL (ref 3.6–4.8)
BUN/Creatinine Ratio: 14 (ref 12–28)
BUN: 12 mg/dL (ref 8–27)
Bilirubin Total: <0.2 mg/dL (ref 0.0–1.2)
CALCIUM: 9.3 mg/dL (ref 8.7–10.3)
CO2: 24 mmol/L (ref 18–29)
CREATININE: 0.85 mg/dL (ref 0.57–1.00)
Chloride: 102 mmol/L (ref 96–106)
GFR calc Af Amer: 86 mL/min/{1.73_m2} (ref >59)
GFR, EST NON AFRICAN AMERICAN: 75 mL/min/{1.73_m2} (ref >59)
GLOBULIN, TOTAL: 2.4 g/dL (ref 1.5–4.5)
Glucose: 87 mg/dL (ref 65–99)
POTASSIUM: 4.2 mmol/L (ref 3.5–5.2)
SODIUM: 141 mmol/L (ref 134–144)
TOTAL PROTEIN: 6.6 g/dL (ref 6.0–8.5)

## 2016-06-04 ENCOUNTER — Telehealth: Payer: Self-pay

## 2016-06-04 ENCOUNTER — Other Ambulatory Visit: Payer: Self-pay

## 2016-06-04 NOTE — Telephone Encounter (Signed)
Gastroenterology Pre-Procedure Review  Request Date: 06/16/16 Requesting Physician: Dr. Sherrie MustacheFisher  PATIENT REVIEW QUESTIONS: The patient responded to the following health history questions as indicated:    1. Are you having any GI issues? no 2. Do you have a personal history of Polyps? no 3. Do you have a family history of Colon Cancer or Polyps? no 4. Diabetes Mellitus? no 5. Joint replacements in the past 12 months?no 6. Major health problems in the past 3 months?no 7. Any artificial heart valves, MVP, or defibrillator?no    MEDICATIONS & ALLERGIES:    Patient reports the following regarding taking any anticoagulation/antiplatelet therapy:   Plavix, Coumadin, Eliquis, Xarelto, Lovenox, Pradaxa, Brilinta, or Effient? no Aspirin? yes (ASA 81mg )  Patient confirms/reports the following medications:  Current Outpatient Prescriptions  Medication Sig Dispense Refill  . aspirin 81 MG tablet Take 1 tablet by mouth daily.    . cetirizine (ZYRTEC) 10 MG tablet Take 10 mg by mouth daily.    . fluticasone (FLONASE) 50 MCG/ACT nasal spray Place 2 sprays into the nose daily.    Marland Kitchen. lisinopril-hydrochlorothiazide (PRINZIDE,ZESTORETIC) 10-12.5 MG per tablet Take 1 tablet by mouth daily. 90 tablet 4  . metoprolol succinate (TOPROL-XL) 50 MG 24 hr tablet Take 1 tablet (50 mg total) by mouth daily. 90 tablet 4  . simvastatin (ZOCOR) 40 MG tablet Take 1 tablet (40 mg total) by mouth at bedtime. 90 tablet 4   No current facility-administered medications for this visit.    Patient confirms/reports the following allergies:  No Known Allergies  No orders of the defined types were placed in this encounter.    AUTHORIZATION INFORMATION Primary Insurance: 1D#: Group #:  Secondary Insurance: 1D#: Group #:  SCHEDULE INFORMATION: Date: 06/16/16 Time: Location: [facility]

## 2016-06-06 ENCOUNTER — Ambulatory Visit (INDEPENDENT_AMBULATORY_CARE_PROVIDER_SITE_OTHER): Payer: Managed Care, Other (non HMO) | Admitting: Family Medicine

## 2016-06-06 ENCOUNTER — Encounter: Payer: Self-pay | Admitting: Family Medicine

## 2016-06-06 VITALS — BP 110/72 | HR 102 | Temp 102.2°F | Resp 20 | Wt 161.0 lb

## 2016-06-06 DIAGNOSIS — J029 Acute pharyngitis, unspecified: Secondary | ICD-10-CM

## 2016-06-06 DIAGNOSIS — R509 Fever, unspecified: Secondary | ICD-10-CM | POA: Diagnosis not present

## 2016-06-06 DIAGNOSIS — J0100 Acute maxillary sinusitis, unspecified: Secondary | ICD-10-CM | POA: Diagnosis not present

## 2016-06-06 LAB — POCT INFLUENZA A/B
Influenza A, POC: NEGATIVE
Influenza B, POC: NEGATIVE

## 2016-06-06 LAB — POCT RAPID STREP A (OFFICE): RAPID STREP A SCREEN: NEGATIVE

## 2016-06-06 MED ORDER — DOXYCYCLINE HYCLATE 100 MG PO TABS: 100.0000 mg | ORAL_TABLET | Freq: Two times a day (BID) | ORAL | Status: DC

## 2016-06-06 NOTE — Progress Notes (Signed)
Patient: Valerie LimboCheryl M Henry Female    DOB: 09/18/1956   60 y.o.   MRN: [id number] Visit Date: 06/06/2016  Today's Provider: Dortha Kernennis Chrismon, PA   No chief complaint on file.  Subjective:    URI  This is a new problem. The current episode started in the past 7 days. The problem has been gradually worsening. There has been no fever. Associated symptoms include congestion, coughing, headaches, a plugged ear sensation, sinus pain and a sore throat. She has tried antihistamine and decongestant (DayQuil and Nyquil ) for the symptoms. The treatment provided mild relief.  Has had some clear sputum with cough and sore throat at onset. Husband had similar symptoms a week before hers started and boss has had bronchitis for 6 months.  Past Medical History  Diagnosis Date  . Allergy     Seasonal  . History of CVA (cerebrovascular accident) 08/17/2014    chickem pox  . Chicken pox   . Measles   . Mumps    Past Surgical History  Procedure Laterality Date  . Tubal ligation  80's   Family History  Problem Relation Age of Onset  . Ovarian cancer Mother   . Heart attack Father   . Dementia Father   . Heart attack Paternal Uncle    No Known Allergies   Current Meds  Medication Sig  . aspirin 81 MG tablet Take 1 tablet by mouth daily. Reported on 06/06/2016  . cetirizine (ZYRTEC) 10 MG tablet Take 10 mg by mouth daily.  . fluticasone (FLONASE) 50 MCG/ACT nasal spray Place 2 sprays into the nose daily.  Marland Kitchen. lisinopril-hydrochlorothiazide (PRINZIDE,ZESTORETIC) 10-12.5 MG per tablet Take 1 tablet by mouth daily.  . metoprolol succinate (TOPROL-XL) 50 MG 24 hr tablet Take 1 tablet (50 mg total) by mouth daily.  . simvastatin (ZOCOR) 40 MG tablet Take 1 tablet (40 mg total) by mouth at bedtime.    Review of Systems  HENT: Positive for congestion and sore throat.   Respiratory: Positive for cough.   Neurological: Positive for headaches.    Social History  Substance Use Topics  .  Smoking status: Never Smoker   . Smokeless tobacco: Not on file  . Alcohol Use: 2.4 oz/week    4 Standard drinks or equivalent per week   Objective:   BP 110/72 mmHg  Pulse 102  Temp(Src) 102.2 F (39 C)  Resp 20  Wt 161 lb (73.029 kg)  SpO2 97%  Physical Exam  Constitutional: She is oriented to person, place, and time. She appears well-developed and well-nourished.  HENT:  Head: Normocephalic.  Right Ear: External ear normal.  Left Ear: External ear normal.  Mouth/Throat: Oropharynx is clear and moist.  Red and 3+ swollen right nostril turbinates. No transillumination of both maxillary sinuses.   Eyes: Conjunctivae and EOM are normal.  Neck: Normal range of motion. Neck supple.  Cardiovascular: Normal rate and regular rhythm.   Pulmonary/Chest: Effort normal and breath sounds normal.  Abdominal: Bowel sounds are normal.  Musculoskeletal: Normal range of motion.  Lymphadenopathy:    She has no cervical adenopathy.  Neurological: She is alert and oriented to person, place, and time.  Skin: No rash noted.      Assessment & Plan:     1. Sore throat Onset over the past week with fever today. Negative strep test. May use saltwater gargles prn. - POCT rapid strep A  2. Acute maxillary sinusitis, recurrence not specified No transillumination of maxillary  sinuses with very red right nasal membranes. Will treat with antibiotic and may use Mucinex-DM or Delsym prn cough from PND. Increase fluid intake and recheck if no better in 5-7 days. - doxycycline (VIBRA-TABS) 100 MG tablet; Take 1 tablet (100 mg total) by mouth 2 (two) times daily.  Dispense: 20 tablet; Refill: 0  3. Fever and chills Flu test negative. Denies exposure to tick bites in the past 3 weeks. Will give Doxycycline prophyllactically. - POCT Influenza A/B       Dortha Kernennis Chrismon, PA  [facility]

## 2016-06-27 ENCOUNTER — Encounter: Payer: Self-pay | Admitting: *Deleted

## 2016-06-30 ENCOUNTER — Other Ambulatory Visit: Payer: Self-pay | Admitting: Family Medicine

## 2016-06-30 NOTE — Discharge Instructions (Signed)

## 2016-06-30 NOTE — Anesthesia Preprocedure Evaluation (Addendum)
Anesthesia Evaluation  Patient identified by MRN, date of birth, ID band Patient awake    Reviewed: Allergy & Precautions, H&P , NPO status , Patient's Chart, lab work & pertinent test results, reviewed documented beta blocker date and time   Airway Mallampati: II  TM Distance: >3 FB Neck ROM: full    Dental no notable dental hx.    Pulmonary neg pulmonary ROS,    Pulmonary exam normal breath sounds clear to auscultation       Cardiovascular Exercise Tolerance: Good hypertension, On Medications and On Home Beta Blockers  Rhythm:regular Rate:Normal     Neuro/Psych CVA associated with uncontrolled HTN.  Last family practice note reveals BP of 110/72 on 06/06/16 CVA, No Residual Symptoms negative psych ROS   GI/Hepatic negative GI ROS, Neg liver ROS,   Endo/Other  negative endocrine ROS  Renal/GU negative Renal ROS  negative genitourinary   Musculoskeletal   Abdominal   Peds  Hematology negative hematology ROS (+)   Anesthesia Other Findings   Reproductive/Obstetrics negative OB ROS                           Anesthesia Physical Anesthesia Plan  ASA: III  Anesthesia Plan: MAC   Post-op Pain Management:    Induction:   Airway Management Planned:   Additional Equipment:   Intra-op Plan:   Post-operative Plan:   Informed Consent: I have reviewed the patients History and Physical, chart, labs and discussed the procedure including the risks, benefits and alternatives for the proposed anesthesia with the patient or authorized representative who has indicated his/her understanding and acceptance.     Plan Discussed with: CRNA  Anesthesia Plan Comments:         Anesthesia Quick Evaluation

## 2016-07-03 ENCOUNTER — Ambulatory Visit: Payer: Managed Care, Other (non HMO) | Admitting: Anesthesiology

## 2016-07-03 ENCOUNTER — Ambulatory Visit
Admission: RE | Admit: 2016-07-03 | Discharge: 2016-07-03 | Disposition: A | Discharge status: Home / Self Care | Payer: Managed Care, Other (non HMO) | Source: Ambulatory Visit | Attending: Gastroenterology | Admitting: Gastroenterology

## 2016-07-03 ENCOUNTER — Encounter
Admission: RE | Disposition: A | Discharge status: Home / Self Care | Payer: Self-pay | Source: Ambulatory Visit | Attending: Gastroenterology

## 2016-07-03 DIAGNOSIS — M19041 Primary osteoarthritis, right hand: Secondary | ICD-10-CM | POA: Diagnosis not present

## 2016-07-03 DIAGNOSIS — Z820 Family history of epilepsy and other diseases of the nervous system: Secondary | ICD-10-CM | POA: Insufficient documentation

## 2016-07-03 DIAGNOSIS — Z79899 Other long term (current) drug therapy: Secondary | ICD-10-CM | POA: Insufficient documentation

## 2016-07-03 DIAGNOSIS — I10 Essential (primary) hypertension: Secondary | ICD-10-CM | POA: Insufficient documentation

## 2016-07-03 DIAGNOSIS — Z8041 Family history of malignant neoplasm of ovary: Secondary | ICD-10-CM | POA: Insufficient documentation

## 2016-07-03 DIAGNOSIS — K5730 Diverticulosis of large intestine without perforation or abscess without bleeding: Secondary | ICD-10-CM | POA: Insufficient documentation

## 2016-07-03 DIAGNOSIS — Z8249 Family history of ischemic heart disease and other diseases of the circulatory system: Secondary | ICD-10-CM | POA: Diagnosis not present

## 2016-07-03 DIAGNOSIS — Z8673 Personal history of transient ischemic attack (TIA), and cerebral infarction without residual deficits: Secondary | ICD-10-CM | POA: Insufficient documentation

## 2016-07-03 DIAGNOSIS — Z1211 Encounter for screening for malignant neoplasm of colon: Secondary | ICD-10-CM | POA: Insufficient documentation

## 2016-07-03 DIAGNOSIS — L814 Other melanin hyperpigmentation: Secondary | ICD-10-CM | POA: Insufficient documentation

## 2016-07-03 DIAGNOSIS — M19042 Primary osteoarthritis, left hand: Secondary | ICD-10-CM | POA: Diagnosis not present

## 2016-07-03 HISTORY — DX: Cerebral infarction, unspecified: I63.9

## 2016-07-03 HISTORY — PX: COLONOSCOPY WITH PROPOFOL: SHX5780

## 2016-07-03 HISTORY — DX: Unspecified osteoarthritis, unspecified site: M19.90

## 2016-07-03 SURGERY — COLONOSCOPY WITH PROPOFOL
Anesthesia: Monitor Anesthesia Care | Wound class: Contaminated

## 2016-07-03 MED ORDER — LACTATED RINGERS IV SOLN: 500.0000 mL | INTRAVENOUS | Status: DC

## 2016-07-03 MED ORDER — LIDOCAINE HCL (CARDIAC) 20 MG/ML IV SOLN
INTRAVENOUS | Status: DC | PRN
  Administered 2016-07-03: 50 mg via INTRAVENOUS

## 2016-07-03 MED ORDER — STERILE WATER FOR IRRIGATION IR SOLN
Status: DC | PRN
  Administered 2016-07-03: 11:00:00

## 2016-07-03 MED ORDER — LACTATED RINGERS IV SOLN
INTRAVENOUS | Status: DC
  Administered 2016-07-03: 11:00:00 via INTRAVENOUS

## 2016-07-03 MED ORDER — PROPOFOL 10 MG/ML IV BOLUS
INTRAVENOUS | Status: DC | PRN
  Administered 2016-07-03 (×2): 50 mg via INTRAVENOUS
  Administered 2016-07-03: 100 mg via INTRAVENOUS
  Administered 2016-07-03: 30 mg via INTRAVENOUS

## 2016-07-03 SURGICAL SUPPLY — 23 items
CANISTER SUCT 1200ML W/VALVE (MISCELLANEOUS) ×3 IMPLANT
CLIP HMST 235XBRD CATH ROT (MISCELLANEOUS) IMPLANT
CLIP RESOLUTION 360 11X235 (MISCELLANEOUS)
FCP ESCP3.2XJMB 240X2.8X (MISCELLANEOUS)
FORCEPS BIOP RAD 4 LRG CAP 4 (CUTTING FORCEPS) IMPLANT
FORCEPS BIOP RJ4 240 W/NDL (MISCELLANEOUS)
FORCEPS ESCP3.2XJMB 240X2.8X (MISCELLANEOUS) IMPLANT
GOWN CVR UNV OPN BCK APRN NK (MISCELLANEOUS) ×2 IMPLANT
GOWN ISOL THUMB LOOP REG UNIV (MISCELLANEOUS) ×4
INJECTOR VARIJECT VIN23 (MISCELLANEOUS) IMPLANT
KIT DEFENDO VALVE AND CONN (KITS) IMPLANT
KIT ENDO PROCEDURE OLY (KITS) ×3 IMPLANT
MARKER SPOT ENDO TATTOO 5ML (MISCELLANEOUS) IMPLANT
PAD GROUND ADULT SPLIT (MISCELLANEOUS) IMPLANT
PROBE APC STR FIRE (PROBE) IMPLANT
RETRIEVER NET ROTH 2.5X230 LF (MISCELLANEOUS) ×3 IMPLANT
SNARE SHORT THROW 13M SML OVAL (MISCELLANEOUS) IMPLANT
SNARE SHORT THROW 30M LRG OVAL (MISCELLANEOUS) IMPLANT
SNARE SNG USE RND 15MM (INSTRUMENTS) IMPLANT
SPOT EX ENDOSCOPIC TATTOO (MISCELLANEOUS)
TRAP ETRAP POLY (MISCELLANEOUS) IMPLANT
VARIJECT INJECTOR VIN23 (MISCELLANEOUS)
WATER STERILE IRR 250ML POUR (IV SOLUTION) ×3 IMPLANT

## 2016-07-03 NOTE — Anesthesia Procedure Notes (Signed)
Procedure Name: MAC Date/Time: 07/03/2016 11:16 AM Performed by: Maree Krabbe Pre-anesthesia Checklist: Patient identified, Emergency Drugs available, Suction available, Timeout performed and Patient being monitored Patient Re-evaluated:Patient Re-evaluated prior to inductionOxygen Delivery Method: Nasal cannula Placement Confirmation: positive ETCO2

## 2016-07-03 NOTE — Transfer of Care (Signed)
Immediate Anesthesia Transfer of Care Note  Patient: Valerie Henry  Procedure(s) Performed: Procedure(s): COLONOSCOPY WITH PROPOFOL (N/A)  Patient Location: PACU  Anesthesia Type: MAC  Level of Consciousness: awake, alert  and patient cooperative  Airway and Oxygen Therapy: Patient Spontanous Breathing and Patient connected to supplemental oxygen  Post-op Assessment: Post-op Vital signs reviewed, Patient's Cardiovascular Status Stable, Respiratory Function Stable, Patent Airway and No signs of Nausea or vomiting  Post-op Vital Signs: Reviewed and stable  Complications: No apparent anesthesia complications

## 2016-07-03 NOTE — Op Note (Signed)
[facility] Gastroenterology Patient Name: Valerie Henry Procedure Date: 07/03/2016 11:15 AM MRN: [id number] Account #: [id number] Date of Birth: 1956/02/20 Admit Type: Outpatient Age: 60 Room: [facility] OR ROOM 01 Gender: Female Note Status: Finalized Procedure:            Colonoscopy Indications:          Screening for colorectal malignant neoplasm Providers:            [name] MD, MD Referring MD:         Demetrios Isaacs. Sherrie Mustache, MD (Referring MD) Medicines:            Propofol per Anesthesia Complications:        No immediate complications. Procedure:            Pre-Anesthesia Assessment:                       - Prior to the procedure, a History and Physical was                        performed, and patient medications and allergies were                        reviewed. The patient's tolerance of previous                        anesthesia was also reviewed. The risks and benefits of                        the procedure and the sedation options and risks were                        discussed with the patient. All questions were                        answered, and informed consent was obtained. Prior                        Anticoagulants: The patient has taken no previous                        anticoagulant or antiplatelet agents. ASA Grade                        Assessment: II - A patient with mild systemic disease.                        After reviewing the risks and benefits, the patient was                        deemed in satisfactory condition to undergo the                        procedure.                       After obtaining informed consent, the colonoscope was                        passed under direct vision. Throughout the procedure,  the patient's blood pressure, pulse, and oxygen                        saturations were monitored continuously. The Olympus CF                        H180AL colonoscope (S#: [id number]) was introduced  through                        the anus and advanced to the the cecum, identified by                        appendiceal orifice and ileocecal valve. The                        colonoscopy was performed without difficulty. The                        patient tolerated the procedure well. The quality of                        the bowel preparation was excellent. Findings:      The perianal and digital rectal examinations were normal.      A diffuse area of severe melanosis was found in the entire colon.      Multiple small-mouthed diverticula were found in the sigmoid colon. Impression:           - Melanosis in the colon.                       - Diverticulosis in the sigmoid colon.                       - No specimens collected. Recommendation:       - Repeat colonoscopy in 10 years for screening unless                        any change in family history or lower GI problems. Procedure Code(s):    --- Professional ---                       [phone], Colonoscopy, flexible; diagnostic, including                        collection of specimen(s) by brushing or washing, when                        performed (separate procedure) Diagnosis Code(s):    --- Professional ---                       Z12.11, Encounter for screening for malignant neoplasm                        of colon CPT copyright 2016 [employer]. All rights reserved. The codes documented in this report are preliminary and upon coder review may  be revised to meet current compliance requirements. [name] MD, MD 07/03/2016 11:34:13 AM This report has been signed electronically. Number of Addenda: 0 Note Initiated On: 07/03/2016 11:15 AM Scope Withdrawal Time: 0 hours 6 minutes 41 seconds  Total Procedure  Duration: 0 hours 12 minutes 35 seconds       [facility]

## 2016-07-03 NOTE — H&P (Signed)
Valerie Minium, MD [facility] [address], [address] [place], [place] [address] Phone: [phone] Fax : [phone]  Primary Care Physician:  Valerie Merry, MD Primary Gastroenterologist:  Dr. Servando Henry  Pre-Procedure History & Physical: HPI:  Valerie Henry is a 60 y.o. female is here for a screening colonoscopy.   Past Medical History:  Diagnosis Date  . Allergy    Seasonal  . Arthritis    thumbs  . Chicken pox   . History of CVA (cerebrovascular accident) 08/17/2014   chickem pox  . Hypertension   . Measles   . Mumps   . Stroke (HCC) 08/17/14   No deficits    Past Surgical History:  Procedure Laterality Date  . TUBAL LIGATION  80's    Prior to Admission medications   Medication Sig Start Date End Date Taking? Authorizing Provider  Ascorbic Acid (VITAMIN C PO) Take by mouth daily.   Yes Historical Provider, MD  aspirin 81 MG tablet Take 1 tablet by mouth daily. Reported on 06/06/2016   Yes Historical Provider, MD  CALCIUM PO Take by mouth daily.   Yes Historical Provider, MD  cetirizine (ZYRTEC) 10 MG tablet Take 10 mg by mouth daily.   Yes Historical Provider, MD  Cholecalciferol (VITAMIN D PO) Take by mouth daily.   Yes Historical Provider, MD  fluticasone (FLONASE) 50 MCG/ACT nasal spray USE TWO SPRAYS IN EACH NOSTRIL ONCE DAILY 06/30/16  Yes Malva Limes, MD  lisinopril-hydrochlorothiazide (PRINZIDE,ZESTORETIC) 10-12.5 MG tablet TAKE ONE TABLET BY MOUTH ONCE DAILY 06/30/16  Yes Malva Limes, MD  metoprolol succinate (TOPROL-XL) 50 MG 24 hr tablet Take 1 tablet (50 mg total) by mouth daily. 07/09/15  Yes Malva Limes, MD  Multiple Vitamin (MULTIVITAMIN) capsule Take 1 capsule by mouth daily.   Yes Historical Provider, MD  POTASSIUM PO Take by mouth daily.   Yes Historical Provider, MD  simvastatin (ZOCOR) 40 MG tablet Take 1 tablet (40 mg total) by mouth at bedtime. 07/09/15  Yes Malva Limes, MD    Allergies as of 06/04/2016  . (No Known Allergies)    Family  History  Problem Relation Age of Onset  . Ovarian cancer Mother   . Heart attack Father   . Dementia Father   . Heart attack Paternal Uncle     Social History   Social History  . Marital status: Married    Spouse name: N/A  . Number of children: 2  . Years of education: HS Grad   Occupational History  . Not on file.   Social History Main Topics  . Smoking status: Never Smoker  . Smokeless tobacco: Not on file  . Alcohol use 2.4 oz/week    4 Standard drinks or equivalent per week  . Drug use: No  . Sexual activity: Not on file   Other Topics Concern  . Not on file   Social History Narrative  . No narrative on file    Review of Systems: See HPI, otherwise negative ROS  Physical Exam: BP (!) 161/80   Pulse 73   Temp 97.7 F (36.5 C) (Temporal)   Resp 16   Ht 5\' 4"  (1.626 m)   Wt 159 lb (72.1 kg)   SpO2 100%   BMI 27.29 kg/m  General:   Alert,  pleasant and cooperative in NAD Head:  Normocephalic and atraumatic. Neck:  Supple; no masses or thyromegaly. Lungs:  Clear throughout to auscultation.    Heart:  Regular rate and rhythm. Abdomen:  Soft, nontender and nondistended. Normal bowel sounds, without guarding, and without rebound.   Neurologic:  Alert and  oriented x4;  grossly normal neurologically.  Impression/Plan: Valerie Henry is now here to undergo a screening colonoscopy.  Risks, benefits, and alternatives regarding colonoscopy have been reviewed with the patient.  Questions have been answered.  All parties agreeable.

## 2016-07-03 NOTE — Anesthesia Postprocedure Evaluation (Signed)
Anesthesia Post Note  Patient: ANALECIA YTUARTE  Procedure(s) Performed: Procedure(s) (LRB): COLONOSCOPY WITH PROPOFOL (N/A)  Patient location during evaluation: PACU Anesthesia Type: MAC Level of consciousness: awake and alert Pain management: pain level controlled Vital Signs Assessment: post-procedure vital signs reviewed and stable Respiratory status: spontaneous breathing, nonlabored ventilation and respiratory function stable Cardiovascular status: stable and blood pressure returned to baseline Anesthetic complications: no    Dominico Rod D Harman Ferrin

## 2016-07-04 ENCOUNTER — Encounter: Payer: Self-pay | Admitting: Gastroenterology

## 2016-07-08 ENCOUNTER — Other Ambulatory Visit: Payer: Self-pay | Admitting: Family Medicine

## 2016-07-08 DIAGNOSIS — Z1231 Encounter for screening mammogram for malignant neoplasm of breast: Secondary | ICD-10-CM

## 2016-07-24 ENCOUNTER — Ambulatory Visit
Admission: RE | Admit: 2016-07-24 | Discharge: 2016-07-24 | Disposition: A | Discharge status: Home / Self Care | Payer: Managed Care, Other (non HMO) | Source: Ambulatory Visit | Attending: Family Medicine | Admitting: Family Medicine

## 2016-07-24 ENCOUNTER — Other Ambulatory Visit: Payer: Self-pay | Admitting: Family Medicine

## 2016-07-24 DIAGNOSIS — Z1231 Encounter for screening mammogram for malignant neoplasm of breast: Secondary | ICD-10-CM | POA: Diagnosis not present

## 2016-07-25 ENCOUNTER — Other Ambulatory Visit: Payer: Self-pay | Admitting: Family Medicine

## 2016-10-06 ENCOUNTER — Other Ambulatory Visit: Payer: Self-pay | Admitting: Family Medicine

## 2017-04-19 ENCOUNTER — Emergency Department (HOSPITAL_COMMUNITY)
Admission: EM | Admit: 2017-04-19 | Discharge: 2017-04-20 | Discharge status: Against Medical Advice | Payer: Managed Care, Other (non HMO) | Attending: Emergency Medicine | Admitting: Emergency Medicine

## 2017-06-01 ENCOUNTER — Encounter: Payer: Managed Care, Other (non HMO) | Admitting: Family Medicine

## 2017-06-09 ENCOUNTER — Ambulatory Visit (INDEPENDENT_AMBULATORY_CARE_PROVIDER_SITE_OTHER): Payer: Managed Care, Other (non HMO) | Admitting: Family Medicine

## 2017-06-09 ENCOUNTER — Encounter: Payer: Self-pay | Admitting: Family Medicine

## 2017-06-09 VITALS — BP 124/70 | HR 58 | Temp 98.2°F | Resp 16 | Ht 63.0 in | Wt 164.0 lb

## 2017-06-09 DIAGNOSIS — Z0000 Encounter for general adult medical examination without abnormal findings: Secondary | ICD-10-CM

## 2017-06-09 DIAGNOSIS — J301 Allergic rhinitis due to pollen: Secondary | ICD-10-CM

## 2017-06-09 DIAGNOSIS — Z1159 Encounter for screening for other viral diseases: Secondary | ICD-10-CM

## 2017-06-09 DIAGNOSIS — Z6829 Body mass index (BMI) 29.0-29.9, adult: Secondary | ICD-10-CM | POA: Diagnosis not present

## 2017-06-09 DIAGNOSIS — I10 Essential (primary) hypertension: Secondary | ICD-10-CM

## 2017-06-09 DIAGNOSIS — Z8041 Family history of malignant neoplasm of ovary: Secondary | ICD-10-CM | POA: Insufficient documentation

## 2017-06-09 DIAGNOSIS — Z8673 Personal history of transient ischemic attack (TIA), and cerebral infarction without residual deficits: Secondary | ICD-10-CM

## 2017-06-09 DIAGNOSIS — E7800 Pure hypercholesterolemia, unspecified: Secondary | ICD-10-CM | POA: Diagnosis not present

## 2017-06-09 MED ORDER — FLUTICASONE PROPIONATE 50 MCG/ACT NA SUSP: NASAL | 2 refills | Status: DC

## 2017-06-09 MED ORDER — METOPROLOL SUCCINATE ER 50 MG PO TB24: 50.0000 mg | ORAL_TABLET | Freq: Every day | ORAL | 4 refills | Status: DC

## 2017-06-09 MED ORDER — CETIRIZINE HCL 10 MG PO TABS: 10.0000 mg | ORAL_TABLET | Freq: Every day | ORAL | 4 refills | Status: DC

## 2017-06-09 MED ORDER — SIMVASTATIN 40 MG PO TABS: 40.0000 mg | ORAL_TABLET | Freq: Every day | ORAL | 4 refills | Status: DC

## 2017-06-09 NOTE — Progress Notes (Signed)
Patient: Valerie Henry, Female    DOB: 12/29/1955, 61 y.o.   MRN: [id number] Visit Date: 06/09/2017  Today's Provider: Mila Merryonald Shaneen Reeser, MD   Chief Complaint  Patient presents with  . Annual Exam  . Hypertension  . Hyperlipidemia   Subjective:    Annual physical exam Valerie Henry is a 61 y.o. female who presents today for health maintenance and complete physical. She feels well. She reports exercising not regularly, but she does stay active. She reports she is sleeping well.  Pap- 05/28/2015. Normal. Repeat in 3 yrs. Mammo- 07/24/2016. Normal. Colonoscopy- 07/03/2016. Diverticulosis. Repeat in 10 yrs. Tdap- 05/28/2015   Hypertension, follow-up:  BP Readings from Last 3 Encounters:  06/09/17 124/70  07/03/16 135/63  06/06/16 110/72    She was last seen for hypertension 1 years ago.  BP at that visit was 140/80. Management since that visit includes no changes. She reports good compliance with treatment. She is not having side effects.  She is not exercising. She is adherent to low salt diet.   Outside blood pressures are checked occasionally. She is experiencing none.  Patient denies exertional chest pressure/discomfort, near-syncope and palpitations.   Cardiovascular risk factors include dyslipidemia.    Weight trend: stable Wt Readings from Last 3 Encounters:  06/09/17 164 lb (74.4 kg)  07/03/16 159 lb (72.1 kg)  06/06/16 161 lb (73 kg)    Current diet: well balanced    Lipid/Cholesterol, Follow-up:   Last seen for this1 years ago.  Management changes since that visit include no changes. . Last Lipid Panel:    Component Value Date/Time   CHOL 171 05/30/2016 1049   CHOL 208 (H) 08/18/2014 0417   TRIG 72 05/30/2016 1049   TRIG 74 08/18/2014 0417   HDL 81 05/30/2016 1049   HDL 70 (H) 08/18/2014 0417   CHOLHDL 2.1 05/30/2016 1049   VLDL 15 08/18/2014 0417   LDLCALC 76 05/30/2016 1049   LDLCALC 123 (H) 08/18/2014 0417    Risk factors for  vascular disease include hypertension  She reports good compliance with treatment. She is not having side effects.  Current symptoms include none and have been stable. Weight trend: stable Prior visit with dietician: no Current diet: well balanced Current exercise: no regular exercise  Wt Readings from Last 3 Encounters:  06/09/17 164 lb (74.4 kg)  07/03/16 159 lb (72.1 kg)  06/06/16 161 lb (73 kg)      Review of Systems  Constitutional: Negative.   HENT: Negative.   Eyes: Negative.   Respiratory: Negative.   Cardiovascular: Negative.   Gastrointestinal: Negative.   Endocrine: Negative.   Genitourinary: Negative.   Musculoskeletal: Negative.   Skin: Negative.   Allergic/Immunologic: Negative.   Neurological: Negative.   Hematological: Negative.   Psychiatric/Behavioral: Negative.     Social History      She  reports that she has never smoked. She has never used smokeless tobacco. She reports that she drinks about 2.4 oz of alcohol per week . She reports that she does not use drugs.       Social History   Social History  . Marital status: Married    Spouse name: N/A  . Number of children: 2  . Years of education: HS Grad   Social History Main Topics  . Smoking status: Never Smoker  . Smokeless tobacco: Never Used  . Alcohol use 2.4 oz/week    4 Standard drinks or equivalent per week  . Drug  use: No  . Sexual activity: Not Asked   Other Topics Concern  . None   Social History Narrative  . None    Past Medical History:  Diagnosis Date  . Allergy    Seasonal  . Arthritis    thumbs  . Chicken pox   . History of CVA (cerebrovascular accident) 08/17/2014   chickem pox  . Hypertension   . Measles   . Mumps   . Stroke (HCC) 08/17/14   No deficits     Patient Active Problem List   Diagnosis Date Noted  . Strain of right knee 05/30/2016  . Allergic rhinitis, seasonal 04/12/2015  . Essential (primary) hypertension 04/12/2015  . HLD  (hyperlipidemia) 04/12/2015  . Cerebrovascular accident, old 08/17/2014    Past Surgical History:  Procedure Laterality Date  . COLONOSCOPY WITH PROPOFOL N/A 07/03/2016   Procedure: COLONOSCOPY WITH PROPOFOL;  Surgeon: [name], MD;  [facility];  Service: Endoscopy;  Laterality: N/A;  . TUBAL LIGATION  80's    Family History        Family Status  Relation Status  . Mother Deceased       Ovarian cancer  . Father Deceased at age 10       MI  . Brother Alive       In good health  . Daughter Alive       In good health  . Son Alive       In good health, adopted  . [employer] Alive  . Neg Hx (Not Specified)        Her family history includes Dementia in her father; Heart attack in her father and paternal uncle; Ovarian cancer in her mother.     Allergies  Allergen Reactions  . Lactose Intolerance (Gi)     GI upset     Current Outpatient Prescriptions:  .  Ascorbic Acid (VITAMIN C PO), Take by mouth daily., Disp: , Rfl:  .  aspirin 81 MG tablet, Take 1 tablet by mouth daily. Reported on 06/06/2016, Disp: , Rfl:  .  CALCIUM PO, Take by mouth daily., Disp: , Rfl:  .  cetirizine (ZYRTEC) 10 MG tablet, Take 10 mg by mouth daily., Disp: , Rfl:  .  Cholecalciferol (VITAMIN D PO), Take by mouth daily., Disp: , Rfl:  .  fluticasone (FLONASE) 50 MCG/ACT nasal spray, USE TWO SPRAYS IN EACH NOSTRIL ONCE DAILY, Disp: 16 g, Rfl: 6 .  lisinopril-hydrochlorothiazide (PRINZIDE,ZESTORETIC) 10-12.5 MG tablet, TAKE ONE TABLET BY MOUTH ONCE DAILY, Disp: 90 tablet, Rfl: 4 .  metoprolol succinate (TOPROL-XL) 50 MG 24 hr tablet, TAKE ONE TABLET BY MOUTH ONCE DAILY, Disp: 30 tablet, Rfl: 11 .  Multiple Vitamin (MULTIVITAMIN) capsule, Take 1 capsule by mouth daily., Disp: , Rfl:  .  POTASSIUM PO, Take by mouth daily., Disp: , Rfl:  .  simvastatin (ZOCOR) 40 MG tablet, TAKE ONE TABLET BY MOUTH AT BEDTIME, Disp: 90 tablet, Rfl: 4   Patient Care Team: Malva Limes, MD as PCP -  General (Family Medicine)      Objective:   Vitals: BP 124/70 (BP Location: Left Arm, Patient Position: Sitting, Cuff Size: Normal)   Pulse (!) 58   Temp 98.2 F (36.8 C)   Resp 16   Ht 5\' 3"  (1.6 m)   Wt 164 lb (74.4 kg)   SpO2 98%   BMI 29.05 kg/m    Vitals:   06/09/17 0914  BP: 124/70  Pulse: (!) 58  Resp: 16  Temp: 98.2 F (36.8 C)  SpO2: 98%  Weight: 164 lb (74.4 kg)  Height: 5\' 3"  (1.6 m)     Physical Exam  Constitutional: She is oriented to person, place, and time. She appears well-developed and well-nourished.  HENT:  Head: Normocephalic and atraumatic.  Right Ear: External ear normal.  Left Ear: External ear normal.  Nose: Nose normal.  Mouth/Throat: Oropharynx is clear and moist.  Eyes: Conjunctivae and EOM are normal. Pupils are equal, round, and reactive to light.  Had eye exam last year.   Neck: Normal range of motion. Neck supple. Carotid bruit is not present.  Cardiovascular: Normal rate, regular rhythm, normal heart sounds and intact distal pulses.   Pulmonary/Chest: Effort normal and breath sounds normal. Right breast exhibits no inverted nipple, no mass, no nipple discharge, no skin change and no tenderness. Left breast exhibits no inverted nipple, no mass, no nipple discharge, no skin change and no tenderness. Breasts are symmetrical.  Musculoskeletal: Normal range of motion. She exhibits no edema.  Neurological: She is alert and oriented to person, place, and time.  Skin: Skin is warm and dry.  Psychiatric: She has a normal mood and affect. Her behavior is normal. Judgment and thought content normal.     Depression Screen PHQ 2/9 Scores 06/09/2017 05/30/2016 05/28/2015  PHQ - 2 Score 0 0 0  PHQ- 9 Score 0 2 -      Assessment & Plan:     Routine Health Maintenance and Physical Exam  Exercise Activities and Dietary recommendations Goals    None      Immunization History  Administered Date(s) Administered  . Tdap 05/28/2015  . Zoster  05/24/2016    Health Maintenance  Topic Date Due  . Hepatitis C Screening  12/21/1955  . HIV Screening  06/09/2028 (Originally 02/04/1971)  . INFLUENZA VACCINE  07/08/2017  . MAMMOGRAM  07/24/2018  . PAP SMEAR  05/27/2020  . TETANUS/TDAP  05/27/2025  . COLONOSCOPY  07/03/2026     Discussed health benefits of physical activity, and encouraged her to engage in regular exercise appropriate for her age and condition.     1. Annual physical exam Doing very well.   2. Need for hepatitis C screening test  - Hepatitis C antibody  3. Essential (primary) hypertension Well controlled.  Continue current medications.   - EKG 12-Lead  4. Seasonal allergic rhinitis due to pollen Well controlled on Zyrtec and nasal steroid. NO decongestants due to hypertension.   5. Pure hypercholesterolemia She is tolerating simvastatin well with no adverse effects.   - Lipid panel - Comprehensive metabolic panel  6. Cerebrovascular accident, old Asymptomatic. Compliant with medication.  Continue aggressive risk factor modification.    7. BMI 29.0-29.9,adult Healthy diet information sent with patient. She Is exercising on bike 3-5 days a week.     Mila Merry, MD  [facility]

## 2017-06-09 NOTE — Patient Instructions (Signed)
Preventive Care 40-64 Years, Female Preventive care refers to lifestyle choices and visits with your health care provider that can promote health and wellness. What does preventive care include?  A yearly physical exam. This is also called an annual well check.  Dental exams once or twice a year.  Routine eye exams. Ask your health care provider how often you should have your eyes checked.  Personal lifestyle choices, including: ? Daily care of your teeth and gums. ? Regular physical activity. ? Eating a healthy diet. ? Avoiding tobacco and drug use. ? Limiting alcohol use. ? Practicing safe sex. ? Taking low-dose aspirin daily starting at age 58. ? Taking vitamin and mineral supplements as recommended by your health care provider. What happens during an annual well check? The services and screenings done by your health care provider during your annual well check will depend on your age, overall health, lifestyle risk factors, and family history of disease. Counseling Your health care provider may ask you questions about your:  Alcohol use.  Tobacco use.  Drug use.  Emotional well-being.  Home and relationship well-being.  Sexual activity.  Eating habits.  Work and work Statistician.  Method of birth control.  Menstrual cycle.  Pregnancy history.  Screening You may have the following tests or measurements:  Height, weight, and BMI.  Blood pressure.  Lipid and cholesterol levels. These may be checked every 5 years, or more frequently if you are over 81 years old.  Skin check.  Lung cancer screening. You may have this screening every year starting at age 78 if you have a 30-pack-year history of smoking and currently smoke or have quit within the past 15 years.  Fecal occult blood test (FOBT) of the stool. You may have this test every year starting at age 65.  Flexible sigmoidoscopy or colonoscopy. You may have a sigmoidoscopy every 5 years or a colonoscopy  every 10 years starting at age 30.  Hepatitis C blood test.  Hepatitis B blood test.  Sexually transmitted disease (STD) testing.  Diabetes screening. This is done by checking your blood sugar (glucose) after you have not eaten for a while (fasting). You may have this done every 1-3 years.  Mammogram. This may be done every 1-2 years. Talk to your health care provider about when you should start having regular mammograms. This may depend on whether you have a family history of breast cancer.  BRCA-related cancer screening. This may be done if you have a family history of breast, ovarian, tubal, or peritoneal cancers.  Pelvic exam and Pap test. This may be done every 3 years starting at age 80. Starting at age 36, this may be done every 5 years if you have a Pap test in combination with an HPV test.  Bone density scan. This is done to screen for osteoporosis. You may have this scan if you are at high risk for osteoporosis.  Discuss your test results, treatment options, and if necessary, the need for more tests with your health care provider. Vaccines Your health care provider may recommend certain vaccines, such as:  Influenza vaccine. This is recommended every year.  Tetanus, diphtheria, and acellular pertussis (Tdap, Td) vaccine. You may need a Td booster every 10 years.  Varicella vaccine. You may need this if you have not been vaccinated.  Zoster vaccine. You may need this after age 5.  Measles, mumps, and rubella (MMR) vaccine. You may need at least one dose of MMR if you were born in  1957 or later. You may also need a second dose.  Pneumococcal 13-valent conjugate (PCV13) vaccine. You may need this if you have certain conditions and were not previously vaccinated.  Pneumococcal polysaccharide (PPSV23) vaccine. You may need one or two doses if you smoke cigarettes or if you have certain conditions.  Meningococcal vaccine. You may need this if you have certain  conditions.  Hepatitis A vaccine. You may need this if you have certain conditions or if you travel or work in places where you may be exposed to hepatitis A.  Hepatitis B vaccine. You may need this if you have certain conditions or if you travel or work in places where you may be exposed to hepatitis B.  Haemophilus influenzae type b (Hib) vaccine. You may need this if you have certain conditions.  Talk to your health care provider about which screenings and vaccines you need and how often you need them. This information is not intended to replace advice given to you by your health care provider. Make sure you discuss any questions you have with your health care provider. Document Released: 12/21/2015 Document Revised: 08/13/2016 Document Reviewed: 09/25/2015 Elsevier Interactive Patient Education  2017 [employer].

## 2017-06-10 LAB — LIPID PANEL
CHOL/HDL RATIO: 2.5 ratio (ref 0.0–4.4)
Cholesterol, Total: 186 mg/dL (ref 100–199)
HDL: 75 mg/dL (ref >39)
LDL Calculated: 95 mg/dL (ref 0–99)
TRIGLYCERIDES: 81 mg/dL (ref 0–149)
VLDL Cholesterol Cal: 16 mg/dL (ref 5–40)

## 2017-06-10 LAB — COMPREHENSIVE METABOLIC PANEL
A/G RATIO: 2 (ref 1.2–2.2)
ALT: 27 IU/L (ref 0–32)
AST: 25 IU/L (ref 0–40)
Albumin: 4.7 g/dL (ref 3.6–4.8)
Alkaline Phosphatase: 107 IU/L (ref 39–117)
BILIRUBIN TOTAL: 0.4 mg/dL (ref 0.0–1.2)
BUN/Creatinine Ratio: 17 (ref 12–28)
BUN: 16 mg/dL (ref 8–27)
CHLORIDE: 100 mmol/L (ref 96–106)
CO2: 24 mmol/L (ref 20–29)
Calcium: 9.8 mg/dL (ref 8.7–10.3)
Creatinine, Ser: 0.92 mg/dL (ref 0.57–1.00)
GFR calc Af Amer: 78 mL/min/{1.73_m2} (ref >59)
GFR, EST NON AFRICAN AMERICAN: 67 mL/min/{1.73_m2} (ref >59)
GLOBULIN, TOTAL: 2.3 g/dL (ref 1.5–4.5)
Glucose: 99 mg/dL (ref 65–99)
Potassium: 4.4 mmol/L (ref 3.5–5.2)
SODIUM: 141 mmol/L (ref 134–144)
Total Protein: 7 g/dL (ref 6.0–8.5)

## 2017-06-10 LAB — HEPATITIS C ANTIBODY

## 2017-06-11 ENCOUNTER — Other Ambulatory Visit: Payer: Self-pay | Admitting: Family Medicine

## 2017-06-11 MED ORDER — LISINOPRIL-HYDROCHLOROTHIAZIDE 10-12.5 MG PO TABS: 1.0000 | ORAL_TABLET | Freq: Every day | ORAL | 4 refills | Status: DC

## 2017-06-17 ENCOUNTER — Other Ambulatory Visit: Payer: Self-pay | Admitting: Family Medicine

## 2017-06-17 DIAGNOSIS — Z1231 Encounter for screening mammogram for malignant neoplasm of breast: Secondary | ICD-10-CM

## 2017-07-27 ENCOUNTER — Ambulatory Visit
Admission: RE | Admit: 2017-07-27 | Discharge: 2017-07-27 | Disposition: A | Discharge status: Home / Self Care | Payer: Managed Care, Other (non HMO) | Source: Ambulatory Visit | Attending: Family Medicine | Admitting: Family Medicine

## 2017-07-27 DIAGNOSIS — Z1231 Encounter for screening mammogram for malignant neoplasm of breast: Secondary | ICD-10-CM | POA: Diagnosis not present

## 2018-06-11 ENCOUNTER — Encounter: Payer: Self-pay | Admitting: Family Medicine

## 2018-06-11 ENCOUNTER — Ambulatory Visit (INDEPENDENT_AMBULATORY_CARE_PROVIDER_SITE_OTHER): Payer: Managed Care, Other (non HMO) | Admitting: Family Medicine

## 2018-06-11 VITALS — BP 112/64 | HR 54 | Temp 98.7°F | Resp 16 | Ht 63.0 in | Wt 161.0 lb

## 2018-06-11 DIAGNOSIS — Z0000 Encounter for general adult medical examination without abnormal findings: Secondary | ICD-10-CM

## 2018-06-11 DIAGNOSIS — E7800 Pure hypercholesterolemia, unspecified: Secondary | ICD-10-CM

## 2018-06-11 DIAGNOSIS — I10 Essential (primary) hypertension: Secondary | ICD-10-CM | POA: Diagnosis not present

## 2018-06-11 DIAGNOSIS — J301 Allergic rhinitis due to pollen: Secondary | ICD-10-CM | POA: Diagnosis not present

## 2018-06-11 NOTE — Patient Instructions (Addendum)
   The CDC recommends two doses of Shingrix (the shingles vaccine) separated by 2 to 6 months for adults age 62 years and older. I recommend checking with your insurance plan regarding coverage for this vaccine.    You may be a candidate for ovarian cancer screening with a CA-125 blood test due to your family history, although the amount of benefit of this test is controversial. You may want to check with insurance regarding coverage for this blood test.

## 2018-06-11 NOTE — Progress Notes (Signed)
Patient: Valerie Henry, Female    DOB: 1956/01/22, 62 y.o.   MRN: [id number] Visit Date: 06/11/2018  Today's Provider: Mila Merry, MD   Chief Complaint  Patient presents with  . Annual Exam   Subjective:    Annual physical exam Valerie Henry is a 62 y.o. female who presents today for health maintenance and complete physical. She feels well. She reports exercising regularly. She reports she is sleeping well.  Colonoscopy- 07/03/2016. Diverticulosis. Repeat in 10 years.  Mammogram- 07/27/2017. BI-RADS 1 Pap- 05/28/2015. HPV negative.   Hypertension, follow-up:  BP Readings from Last 3 Encounters:  06/11/18 112/64  06/09/17 124/70  07/03/16 135/63    She was last seen for hypertension 1 years ago.  BP at that visit was 124/70. Management since that visit includes no changes. She reports good compliance with treatment. She is not having side effects.  She is exercising. She is adherent to low salt diet.   Outside blood pressures are checked occasionally. She is experiencing none.  Patient denies exertional chest pressure/discomfort, lower extremity edema and palpitations.   Cardiovascular risk factors include dyslipidemia.   Weight trend: stable Wt Readings from Last 3 Encounters:  06/11/18 161 lb (73 kg)  06/09/17 164 lb (74.4 kg)  07/03/16 159 lb (72.1 kg)    Current diet: well balanced    Review of Systems  Constitutional: Negative.   HENT: Negative.   Eyes: Negative.   Respiratory: Negative.   Cardiovascular: Negative.   Gastrointestinal: Negative.   Endocrine: Negative.   Genitourinary: Negative.   Musculoskeletal: Negative.   Skin: Negative.   Allergic/Immunologic: Negative.   Neurological: Negative.   Hematological: Negative.   Psychiatric/Behavioral: Negative.     Social History      She  reports that she has never smoked. She has never used smokeless tobacco. She reports that she drinks about 2.4 oz of alcohol per week. She  reports that she does not use drugs.       Social History   Socioeconomic History  . Marital status: Married    Spouse name: Not on file  . Number of children: 2  . Years of education: HS Grad  . Highest education level: Not on file  Occupational History  . Not on file  Social Needs  . Financial resource strain: Not on file  . Food insecurity:    Worry: Not on file    Inability: Not on file  . Transportation needs:    Medical: Not on file    Non-medical: Not on file  Tobacco Use  . Smoking status: Never Smoker  . Smokeless tobacco: Never Used  Substance and Sexual Activity  . Alcohol use: Yes    Alcohol/week: 2.4 oz    Types: 4 Standard drinks or equivalent per week  . Drug use: No  . Sexual activity: Not on file  Lifestyle  . Physical activity:    Days per week: Not on file    Minutes per session: Not on file  . Stress: Not on file  Relationships  . Social connections:    Talks on phone: Not on file    Gets together: Not on file    Attends religious service: Not on file    Active member of club or organization: Not on file    Attends meetings of clubs or organizations: Not on file    Relationship status: Not on file  Other Topics Concern  . Not on file  Social  History Narrative  . Not on file    Past Medical History:  Diagnosis Date  . Arthritis    thumbs  . Chicken pox   . Measles   . Mumps   . Stroke (HCC) 08/17/14   No deficits     Patient Active Problem List   Diagnosis Date Noted  . Family history of ovarian cancer 06/09/2017  . Strain of right knee 05/30/2016  . Allergic rhinitis, seasonal 04/12/2015  . Essential (primary) hypertension 04/12/2015  . HLD (hyperlipidemia) 04/12/2015  . Cerebrovascular accident, old 08/17/2014    Past Surgical History:  Procedure Laterality Date  . COLONOSCOPY WITH PROPOFOL N/A 07/03/2016   Procedure: COLONOSCOPY WITH PROPOFOL;  Surgeon: [name], MD;  Location: [facility];  Service: Endoscopy;   Laterality: N/A;  . TUBAL LIGATION  80's    Family History        Family Status  Relation Name Status  . Mother  Deceased       Ovarian cancer  . Father  Deceased at age 29       MI  . Brother  Alive       In good health  . Daughter  Alive       In good health  . Son  Alive       In good health, adopted  . [employer]  Alive  . Neg Hx  (Not Specified)        Her family history includes Dementia in her father; Heart attack in her father and paternal uncle; Ovarian cancer in her mother. There is no history of Breast cancer.      Allergies  Allergen Reactions  . Lactose Intolerance (Gi)     GI upset     Current Outpatient Medications:  .  Ascorbic Acid (VITAMIN C PO), Take by mouth daily., Disp: , Rfl:  .  aspirin 81 MG tablet, Take 1 tablet by mouth daily. Reported on 06/06/2016, Disp: , Rfl:  .  CALCIUM PO, Take by mouth daily., Disp: , Rfl:  .  cetirizine (ZYRTEC) 10 MG tablet, Take 1 tablet (10 mg total) by mouth daily., Disp: 90 tablet, Rfl: 4 .  Cholecalciferol (VITAMIN D PO), Take by mouth daily., Disp: , Rfl:  .  fluticasone (FLONASE) 50 MCG/ACT nasal spray, USE TWO SPRAYS IN EACH NOSTRIL ONCE DAILY, Disp: 42 g, Rfl: 2 .  lisinopril-hydrochlorothiazide (PRINZIDE,ZESTORETIC) 10-12.5 MG tablet, Take 1 tablet by mouth daily., Disp: 90 tablet, Rfl: 4 .  metoprolol succinate (TOPROL-XL) 50 MG 24 hr tablet, Take 1 tablet (50 mg total) by mouth daily. Take with or immediately following a meal., Disp: 90 tablet, Rfl: 4 .  Multiple Vitamin (MULTIVITAMIN) capsule, Take 1 capsule by mouth daily., Disp: , Rfl:  .  POTASSIUM PO, Take by mouth daily., Disp: , Rfl:  .  simvastatin (ZOCOR) 40 MG tablet, Take 1 tablet (40 mg total) by mouth at bedtime., Disp: 90 tablet, Rfl: 4   Patient Care Team: Malva Limes, MD as PCP - General (Family Medicine)      Objective:   Vitals: BP 112/64 (BP Location: Right Arm, Patient Position: Sitting, Cuff Size: Normal)   Pulse (!) 54   Temp  98.7 F (37.1 C)   Resp 16   Ht 5\' 3"  (1.6 m)   Wt 161 lb (73 kg)   SpO2 97%   BMI 28.52 kg/m    Vitals:   06/11/18 0905  BP: 112/64  Pulse: (!) 54  Resp: 16  Temp: 98.7 F (37.1 C)  SpO2: 97%  Weight: 161 lb (73 kg)  Height: 5\' 3"  (1.6 m)     Physical Exam   General Appearance:    Alert, cooperative, no distress, appears stated age  Head:    Normocephalic, without obvious abnormality, atraumatic  Eyes:    PERRL, conjunctiva/corneas clear, EOM's intact, fundi    benign, both eyes  Ears:    Normal TM's and external ear canals, both ears  Nose:   Nares normal, septum midline, mucosa normal, no drainage    or sinus tenderness  Throat:   Lips, mucosa, and tongue normal; teeth and gums normal  Neck:   Supple, symmetrical, trachea midline, no adenopathy;    thyroid:  no enlargement/tenderness/nodules; no carotid   bruit or JVD  Back:     Symmetric, no curvature, ROM normal, no CVA tenderness  Lungs:     Clear to auscultation bilaterally, respirations unlabored  Chest Wall:    No tenderness or deformity   Heart:    Regular rate and rhythm, S1 and S2 normal, no murmur, rub   or gallop  Breast Exam:    normal appearance, no masses or tenderness  Abdomen:     Soft, non-tender, bowel sounds active all four quadrants,    no masses, no organomegaly  Pelvic:    deferred  Extremities:   Extremities normal, atraumatic, no cyanosis or edema  Pulses:   2+ and symmetric all extremities  Skin:   Skin color, texture, turgor normal, no rashes or lesions  Lymph nodes:   Cervical, supraclavicular, and axillary nodes normal  Neurologic:   CNII-XII intact, normal strength, sensation and reflexes    throughout    Depression Screen PHQ 2/9 Scores 06/11/2018 06/09/2017 05/30/2016 05/28/2015  PHQ - 2 Score 0 0 0 0  PHQ- 9 Score 0 0 2 -      Assessment & Plan:     Routine Health Maintenance and Physical Exam  Exercise Activities and Dietary recommendations Goals    None       Immunization History  Administered Date(s) Administered  . Tdap 05/28/2015  . Zoster 05/24/2016    Health Maintenance  Topic Date Due  . HIV Screening  06/09/2028 (Originally 02/04/1971)  . INFLUENZA VACCINE  07/08/2018  . MAMMOGRAM  07/28/2019  . PAP SMEAR  05/27/2020  . TETANUS/TDAP  05/27/2025  . COLONOSCOPY  07/03/2026  . Hepatitis C Screening  Completed     Discussed health benefits of physical activity, and encouraged her to engage in regular exercise appropriate for her age and condition.   1. Annual physical exam Normal exam doing well. She declined shingrix today as she had Zostavax in 2017.  - Comprehensive metabolic panel - Lipid panel - EKG 12-Lead  2. Essential (primary) hypertension Well controlled.  Continue current medications.   - EKG 12-Lead  3. Seasonal allergic rhinitis due to pollen Doing well with intermittent use of fluticasone nasal sprqy.   4. Pure hypercholesterolemia She is tolerating simvastatin well with no adverse effects.   - Comprehensive metabolic panel - Lipid panel   Mila Merryonald Jaelle Campanile, MD  [facility]

## 2018-06-12 LAB — LIPID PANEL
CHOLESTEROL TOTAL: 174 mg/dL (ref 100–199)
Chol/HDL Ratio: 2.6 ratio (ref 0.0–4.4)
HDL: 67 mg/dL (ref >39)
LDL CALC: 93 mg/dL (ref 0–99)
TRIGLYCERIDES: 71 mg/dL (ref 0–149)
VLDL Cholesterol Cal: 14 mg/dL (ref 5–40)

## 2018-06-12 LAB — COMPREHENSIVE METABOLIC PANEL
ALBUMIN: 4.5 g/dL (ref 3.6–4.8)
ALK PHOS: 86 IU/L (ref 39–117)
ALT: 21 IU/L (ref 0–32)
AST: 25 IU/L (ref 0–40)
Albumin/Globulin Ratio: 2 (ref 1.2–2.2)
BILIRUBIN TOTAL: 0.3 mg/dL (ref 0.0–1.2)
BUN/Creatinine Ratio: 17 (ref 12–28)
BUN: 15 mg/dL (ref 8–27)
CHLORIDE: 104 mmol/L (ref 96–106)
CO2: 22 mmol/L (ref 20–29)
Calcium: 9.5 mg/dL (ref 8.7–10.3)
Creatinine, Ser: 0.88 mg/dL (ref 0.57–1.00)
GFR calc Af Amer: 81 mL/min/{1.73_m2} (ref >59)
GFR calc non Af Amer: 71 mL/min/{1.73_m2} (ref >59)
GLUCOSE: 94 mg/dL (ref 65–99)
Globulin, Total: 2.2 g/dL (ref 1.5–4.5)
Potassium: 4.3 mmol/L (ref 3.5–5.2)
SODIUM: 142 mmol/L (ref 134–144)
Total Protein: 6.7 g/dL (ref 6.0–8.5)

## 2018-06-24 ENCOUNTER — Other Ambulatory Visit: Payer: Self-pay | Admitting: Family Medicine

## 2018-06-24 DIAGNOSIS — Z1231 Encounter for screening mammogram for malignant neoplasm of breast: Secondary | ICD-10-CM

## 2018-07-08 ENCOUNTER — Other Ambulatory Visit: Payer: Self-pay | Admitting: Family Medicine

## 2018-07-28 ENCOUNTER — Ambulatory Visit
Admission: RE | Admit: 2018-07-28 | Discharge: 2018-07-28 | Disposition: A | Discharge status: Home / Self Care | Payer: Managed Care, Other (non HMO) | Source: Ambulatory Visit | Attending: Family Medicine | Admitting: Family Medicine

## 2018-07-28 DIAGNOSIS — Z1231 Encounter for screening mammogram for malignant neoplasm of breast: Secondary | ICD-10-CM | POA: Insufficient documentation

## 2018-07-30 ENCOUNTER — Other Ambulatory Visit: Payer: Self-pay | Admitting: Family Medicine

## 2018-08-14 ENCOUNTER — Other Ambulatory Visit: Payer: Self-pay | Admitting: Family Medicine

## 2018-12-07 ENCOUNTER — Other Ambulatory Visit: Payer: Self-pay | Admitting: Family Medicine

## 2019-06-17 ENCOUNTER — Ambulatory Visit (INDEPENDENT_AMBULATORY_CARE_PROVIDER_SITE_OTHER): Payer: No Typology Code available for payment source | Admitting: Family Medicine

## 2019-06-17 ENCOUNTER — Encounter: Payer: Self-pay | Admitting: Family Medicine

## 2019-06-17 ENCOUNTER — Other Ambulatory Visit: Payer: Self-pay

## 2019-06-17 VITALS — BP 134/82 | HR 64 | Temp 98.3°F | Resp 16 | Ht 63.0 in | Wt 150.0 lb

## 2019-06-17 DIAGNOSIS — Z0000 Encounter for general adult medical examination without abnormal findings: Secondary | ICD-10-CM

## 2019-06-17 DIAGNOSIS — I10 Essential (primary) hypertension: Secondary | ICD-10-CM

## 2019-06-17 DIAGNOSIS — E7800 Pure hypercholesterolemia, unspecified: Secondary | ICD-10-CM

## 2019-06-17 DIAGNOSIS — Z8041 Family history of malignant neoplasm of ovary: Secondary | ICD-10-CM

## 2019-06-17 NOTE — Patient Instructions (Addendum)
.   Please review the attached list of medications and notify my office if there are any errors.   . Please bring all of your medications to every appointment so we can make sure that our medication list is the same as yours.   . We will have flu vaccines available after Labor Day. Please go to your pharmacy or call the office in early September to schedule you flu shot.   The CDC recommends two doses of Shingrix (the shingles vaccine) separated by 2 to 6 months for adults age 50 years and older. I recommend checking with your insurance plan regarding coverage for this vaccine.      

## 2019-06-17 NOTE — Progress Notes (Signed)
Patient: Valerie Henry, Female    DOB: 04/27/1956, 63 y.o.   MRN: [id number] Visit Date: 06/17/2019  Today's Provider: Lelon Huh, MD   Chief Complaint  Patient presents with  . Annual Exam   Subjective:     Annual physical exam Valerie Henry is a 63 y.o. female who presents today for health maintenance and complete physical. She feels well. She reports exercising some. She reports she is sleeping well.  -----------------------------------------------------------------  Follow up hypertension and allergies.  States she feels well, tolerating medications well with no adverse effects. Does not check BP out of office regularly. Avoids decongestants. No chest pain, dyspnea, or heart flutters.   Review of Systems  Constitutional: Negative.   HENT: Negative.   Eyes: Negative.   Respiratory: Negative.   Cardiovascular: Negative.   Gastrointestinal: Negative.   Endocrine: Negative.   Genitourinary: Negative.   Musculoskeletal: Positive for neck stiffness. Negative for arthralgias, back pain, gait problem, joint swelling, myalgias and neck pain.  Skin: Negative.   Allergic/Immunologic: Positive for environmental allergies and food allergies. Negative for immunocompromised state.  Neurological: Negative.   Hematological: Negative.   Psychiatric/Behavioral: Negative.     Social History      She  reports that she has never smoked. She has never used smokeless tobacco. She reports current alcohol use of about 4.0 standard drinks of alcohol per week. She reports that she does not use drugs.       Social History   Socioeconomic History  . Marital status: Married    Spouse name: Not on file  . Number of children: 2  . Years of education: HS Grad  . Highest education level: Not on file  Occupational History  . Not on file  Social Needs  . Financial resource strain: Not on file  . Food insecurity    Worry: Not on file    Inability: Not on file  .  Transportation needs    Medical: Not on file    Non-medical: Not on file  Tobacco Use  . Smoking status: Never Smoker  . Smokeless tobacco: Never Used  Substance and Sexual Activity  . Alcohol use: Yes    Alcohol/week: 4.0 standard drinks    Types: 4 Standard drinks or equivalent per week  . Drug use: No  . Sexual activity: Not on file  Lifestyle  . Physical activity    Days per week: Not on file    Minutes per session: Not on file  . Stress: Not on file  Relationships  . Social Herbalist on phone: Not on file    Gets together: Not on file    Attends religious service: Not on file    Active member of club or organization: Not on file    Attends meetings of clubs or organizations: Not on file    Relationship status: Not on file  Other Topics Concern  . Not on file  Social History Narrative  . Not on file    Past Medical History:  Diagnosis Date  . Arthritis    thumbs  . Chicken pox   . Measles   . Mumps   . Stroke ([place]) 08/17/14   No deficits     Patient Active Problem List   Diagnosis Date Noted  . Family history of ovarian cancer 06/09/2017  . Strain of right knee 05/30/2016  . Allergic rhinitis, seasonal 04/12/2015  . Essential (primary) hypertension 04/12/2015  . HLD (  hyperlipidemia) 04/12/2015  . Cerebrovascular accident, old 08/17/2014    Past Surgical History:  Procedure Laterality Date  . COLONOSCOPY WITH PROPOFOL N/A 07/03/2016   Procedure: COLONOSCOPY WITH PROPOFOL;  Surgeon: Midge Miniumarren Wohl, MD;  [facility];  Service: Endoscopy;  Laterality: N/A;  . TUBAL LIGATION  80's    Family History        Family Status  Relation Name Status  . Mother  Deceased       Ovarian cancer  . Father  Deceased at age 63       MI  . Brother  Alive       In good health  . Daughter  Alive       In good health  . Son  Alive       In good health, adopted  . [employer] Uncle  Alive  . Neg Hx  (Not Specified)        Her family history  includes Dementia in her father; Heart attack in her father and paternal uncle; Ovarian cancer in her mother. There is no history of Breast cancer.      Allergies  Allergen Reactions  . Lactose Intolerance (Gi)     GI upset     Current Outpatient Medications:  .  Ascorbic Acid (VITAMIN C PO), Take by mouth daily., Disp: , Rfl:  .  aspirin 81 MG tablet, Take 1 tablet by mouth daily. Reported on 06/06/2016, Disp: , Rfl:  .  CALCIUM PO, Take by mouth daily., Disp: , Rfl:  .  cetirizine (ZYRTEC) 10 MG tablet, TAKE 1 TABLET BY MOUTH ONCE DAILY, Disp: 30 tablet, Rfl: 11 .  Cholecalciferol (VITAMIN D PO), Take by mouth daily., Disp: , Rfl:  .  fluticasone (FLONASE) 50 MCG/ACT nasal spray, USE 2 SPRAY(S) IN EACH NOSTRIL ONCE DAILY, Disp: 16 g, Rfl: 5 .  lisinopril-hydrochlorothiazide (PRINZIDE,ZESTORETIC) 10-12.5 MG tablet, TAKE 1 TABLET BY MOUTH ONCE DAILY, Disp: 90 tablet, Rfl: 4 .  metoprolol succinate (TOPROL-XL) 50 MG 24 hr tablet, TAKE 1 TABLET BY MOUTH ONCE DAILY .  TAKE  WITH  OR  IMMEDIATELY  FOLLOWING  A  MEAL., Disp: 90 tablet, Rfl: 4 .  Multiple Vitamin (MULTIVITAMIN) capsule, Take 1 capsule by mouth daily., Disp: , Rfl:  .  POTASSIUM PO, Take by mouth daily., Disp: , Rfl:  .  simvastatin (ZOCOR) 40 MG tablet, TAKE 1 TABLET BY MOUTH AT BEDTIME, Disp: 90 tablet, Rfl: 4   Patient Care Team: Malva LimesFisher, Maykel Reitter E, MD as PCP - General (Family Medicine)    Objective:    Vitals: BP 134/82 (BP Location: Right Arm, Patient Position: Sitting, Cuff Size: Normal)   Pulse 64   Temp 98.3 F (36.8 C) (Oral)   Resp 16   Ht 5\' 3"  (1.6 m)   Wt 150 lb (68 kg)   BMI 26.57 kg/m    Vitals:   06/17/19 0920  BP: 134/82  Pulse: 64  Resp: 16  Temp: 98.3 F (36.8 C)  TempSrc: Oral  Weight: 150 lb (68 kg)  Height: 5\' 3"  (1.6 m)     Physical Exam   General Appearance:    Alert, cooperative, no distress, appears stated age  Head:    Normocephalic, without obvious abnormality, atraumatic   Eyes:    PERRL, conjunctiva/corneas clear, EOM's intact, fundi    benign, both eyes  Ears:    Normal TM's and external ear canals, both ears  Nose:   Nares normal, septum midline, mucosa  normal, no drainage    or sinus tenderness  Throat:   Lips, mucosa, and tongue normal; teeth and gums normal  Neck:   Supple, symmetrical, trachea midline, no adenopathy;    thyroid:  no enlargement/tenderness/nodules; no carotid   bruit or JVD  Back:     Symmetric, no curvature, ROM normal, no CVA tenderness  Lungs:     Clear to auscultation bilaterally, respirations unlabored  Chest Wall:    No tenderness or deformity   Heart:    Regular rate and rhythm, S1 and S2 normal, no murmur, rub   or gallop  Breast Exam:    normal appearance, no masses or tenderness  Abdomen:     Soft, non-tender, bowel sounds active all four quadrants,    no masses, no organomegaly  Pelvic:    deferred  Extremities:   Extremities normal, atraumatic, no cyanosis or edema  Pulses:   2+ and symmetric all extremities  Skin:   Skin color, texture, turgor normal, no rashes or lesions  Lymph nodes:   Cervical, supraclavicular, and axillary nodes normal  Neurologic:   CNII-XII intact, normal strength, sensation and reflexes    throughout    Depression Screen PHQ 2/9 Scores 06/17/2019 06/11/2018 06/09/2017 05/30/2016  PHQ - 2 Score 0 0 0 0  PHQ- 9 Score 0 0 0 2       Assessment & Plan:     Routine Health Maintenance and Physical Exam  Exercise Activities and Dietary recommendations Goals   None     Immunization History  Administered Date(s) Administered  . Tdap 05/28/2015  . Zoster 05/24/2016    Health Maintenance  Topic Date Due  . HIV Screening  06/09/2028 (Originally 02[birth year]04/1971)  . INFLUENZA VACCINE  07/09/2019  . PAP SMEAR-Modifier  05/27/2020  . MAMMOGRAM  07/28/2020  . TETANUS/TDAP  05/27/2025  . COLONOSCOPY  07/03/2026  . Hepatitis C Screening  Completed     Discussed health benefits of physical  activity, and encouraged her to engage in regular exercise appropriate for her age and condition.    --------------------------------------------------------------------  1. Annual physical exam Doing well with no complaints and normal exam. Mammogram due in October, pap due 2021 - Comprehensive metabolic panel - Lipid panel - CBC  2. Family history of ovarian cancer She requests CA-125 test. Discussed limited sensitivity and specificity of test - CA 125  3. Essential (primary) hypertension Well controlled.  Continue current medications.   - Comprehensive metabolic panel - TSH  4. Pure hypercholesterolemia She is tolerating simvastatin well with no adverse effects.   - Comprehensive metabolic panel - Lipid panel - CBC - TSH   Mila Merryonald Jevaeh Shams, MD  [facility]

## 2019-06-18 LAB — COMPREHENSIVE METABOLIC PANEL
ALT: 21 IU/L (ref 0–32)
AST: 27 IU/L (ref 0–40)
Albumin/Globulin Ratio: 2.4 — ABNORMAL HIGH (ref 1.2–2.2)
Albumin: 4.6 g/dL (ref 3.8–4.8)
Alkaline Phosphatase: 84 IU/L (ref 39–117)
BUN/Creatinine Ratio: 16 (ref 12–28)
BUN: 13 mg/dL (ref 8–27)
Bilirubin Total: 0.3 mg/dL (ref 0.0–1.2)
CO2: 21 mmol/L (ref 20–29)
Calcium: 9.9 mg/dL (ref 8.7–10.3)
Chloride: 103 mmol/L (ref 96–106)
Creatinine, Ser: 0.81 mg/dL (ref 0.57–1.00)
GFR calc Af Amer: 89 mL/min/{1.73_m2} (ref >59)
GFR calc non Af Amer: 78 mL/min/{1.73_m2} (ref >59)
Globulin, Total: 1.9 g/dL (ref 1.5–4.5)
Glucose: 93 mg/dL (ref 65–99)
Potassium: 4.2 mmol/L (ref 3.5–5.2)
Sodium: 141 mmol/L (ref 134–144)
Total Protein: 6.5 g/dL (ref 6.0–8.5)

## 2019-06-18 LAB — CBC
Hematocrit: 42 % (ref 34.0–46.6)
Hemoglobin: 13.9 g/dL (ref 11.1–15.9)
MCH: 28.1 pg (ref 26.6–33.0)
MCHC: 33.1 g/dL (ref 31.5–35.7)
MCV: 85 fL (ref 79–97)
Platelets: 276 10*3/uL (ref 150–450)
RBC: 4.94 x10E6/uL (ref 3.77–5.28)
RDW: 12.9 % (ref 11.7–15.4)
WBC: 5.1 10*3/uL (ref 3.4–10.8)

## 2019-06-18 LAB — TSH: TSH: 1.4 u[IU]/mL (ref 0.450–4.500)

## 2019-06-18 LAB — LIPID PANEL
Chol/HDL Ratio: 2.6 ratio (ref 0.0–4.4)
Cholesterol, Total: 166 mg/dL (ref 100–199)
HDL: 65 mg/dL (ref >39)
LDL Calculated: 85 mg/dL (ref 0–99)
Triglycerides: 81 mg/dL (ref 0–149)
VLDL Cholesterol Cal: 16 mg/dL (ref 5–40)

## 2019-06-18 LAB — CA 125: Cancer Antigen (CA) 125: 12.2 U/mL (ref 0.0–38.1)

## 2019-06-28 ENCOUNTER — Other Ambulatory Visit: Payer: Self-pay | Admitting: Family Medicine

## 2019-06-30 ENCOUNTER — Other Ambulatory Visit: Payer: Self-pay | Admitting: Family Medicine

## 2019-06-30 DIAGNOSIS — Z1231 Encounter for screening mammogram for malignant neoplasm of breast: Secondary | ICD-10-CM

## 2019-08-04 ENCOUNTER — Ambulatory Visit
Admission: RE | Admit: 2019-08-04 | Discharge: 2019-08-04 | Disposition: A | Discharge status: Home / Self Care | Payer: PRIVATE HEALTH INSURANCE | Source: Ambulatory Visit | Attending: Family Medicine | Admitting: Family Medicine

## 2019-08-04 DIAGNOSIS — Z1231 Encounter for screening mammogram for malignant neoplasm of breast: Secondary | ICD-10-CM | POA: Insufficient documentation

## 2019-08-20 ENCOUNTER — Other Ambulatory Visit: Payer: Self-pay | Admitting: Family Medicine

## 2019-10-17 ENCOUNTER — Other Ambulatory Visit: Payer: Self-pay | Admitting: Family Medicine

## 2020-06-12 ENCOUNTER — Telehealth (INDEPENDENT_AMBULATORY_CARE_PROVIDER_SITE_OTHER): Payer: No Typology Code available for payment source | Admitting: Family Medicine

## 2020-06-12 ENCOUNTER — Encounter: Payer: Self-pay | Admitting: Family Medicine

## 2020-06-12 ENCOUNTER — Telehealth: Payer: Self-pay

## 2020-06-12 DIAGNOSIS — L255 Unspecified contact dermatitis due to plants, except food: Secondary | ICD-10-CM | POA: Diagnosis not present

## 2020-06-12 MED ORDER — PREDNISONE 5 MG PO TABS: ORAL_TABLET | ORAL | 0 refills | Status: DC

## 2020-06-12 NOTE — Telephone Encounter (Signed)
Copied from CRM [id number]. Topic: General - Other >> Jun 12, 2020  9:08 AM Jaquita Rector A wrote:  Reason for CRM: Patient called to inform Dr Sherrie Mustache that she have poison Ivy since the weekend  and it is spreading to her face. Asking if there is something that can be prescribed to the pharmacy to get rid of it. Please call patient to let her know something at Ph#  [phone]

## 2020-06-12 NOTE — Progress Notes (Signed)
MyChart Video Visit    Virtual Visit via Video Note   This visit type was conducted due to national recommendations for restrictions regarding the COVID-19 Pandemic (e.g. social distancing) in an effort to limit this patient's exposure and mitigate transmission in our community. This patient is at least at moderate risk for complications without adequate follow up. This format is felt to be most appropriate for this patient at this time. Physical exam was limited by quality of the video and audio technology used for the visit.   Patient location: Home Provider location: Office   Patient: Valerie Henry   DOB: 02/09/1956   64 y.o. Female  MRN: [id number] Visit Date: 06/12/2020  Today's healthcare provider: Dortha Kern, PA   Chief Complaint  Patient presents with  . Rash   Subjective    Rash This is a new problem. The current episode started in the past 7 days. The affected locations include the left arm, right arm, neck and face (both thumbs). The rash is characterized by itchiness and blistering. She was exposed to plant contact (Poison ivy?). Pertinent negatives include no congestion, cough, diarrhea, eye pain, facial edema, fatigue, fever, joint pain, rhinorrhea, shortness of breath, sore throat or vomiting. Past treatments include antihistamine. The treatment provided mild relief.    Past Medical History:  Diagnosis Date  . Arthritis    thumbs  . Chicken pox   . Measles   . Mumps   . Stroke (HCC) 08/17/14   No deficits   Social History   Tobacco Use  . Smoking status: Never Smoker  . Smokeless tobacco: Never Used  Vaping Use  . Vaping Use: Never used  Substance Use Topics  . Alcohol use: Yes    Alcohol/week: 4.0 standard drinks    Types: 4 Standard drinks or equivalent per week  . Drug use: No   Family Status  Relation Name Status  . Mother  Deceased       Ovarian cancer  . Father  Deceased at age 46       MI  . Brother  Alive       In good health   . Daughter  Alive       In good health  . Son  Alive       In good health, adopted  . [employer]  Alive  . Neg Hx  (Not Specified)   Allergies  Allergen Reactions  . Lactose Intolerance (Gi)     GI upset      Medications: Outpatient Medications Prior to Visit  Medication Sig  . Ascorbic Acid (VITAMIN C PO) Take by mouth daily.  Marland Kitchen aspirin 81 MG tablet Take 1 tablet by mouth daily. Reported on 06/06/2016  . CALCIUM PO Take by mouth daily.  . Cholecalciferol (VITAMIN D PO) Take by mouth daily.  Burman Blacksmith ALLERGY RELIEF, CETIRIZINE, 10 MG tablet Take 1 tablet by mouth once daily  . fluticasone (FLONASE) 50 MCG/ACT nasal spray USE 2 SPRAY(S) IN EACH NOSTRIL ONCE DAILY  . lisinopril-hydrochlorothiazide (ZESTORETIC) 10-12.5 MG tablet Take 1 tablet by mouth once daily  . metoprolol succinate (TOPROL-XL) 50 MG 24 hr tablet TAKE 1 TABLET BY MOUTH ONCE DAILY. TAKE WITH OR IMMEDIATELY FOLLOWING A MEAL  . Multiple Vitamin (MULTIVITAMIN) capsule Take 1 capsule by mouth daily.  Marland Kitchen POTASSIUM PO Take by mouth daily.  . simvastatin (ZOCOR) 40 MG tablet TAKE 1 TABLET BY MOUTH AT BEDTIME   No facility-administered medications prior to visit.  Review of Systems  Constitutional: Negative for fatigue and fever.  HENT: Negative for congestion, rhinorrhea and sore throat.   Eyes: Negative for pain.  Respiratory: Negative for cough and shortness of breath.   Gastrointestinal: Negative for diarrhea and vomiting.  Musculoskeletal: Negative for joint pain.  Skin: Positive for rash.      Objective    There were no vitals taken for this visit.   Physical Exam:WDWN female in no apparent distress.  Head: Normocephalic, atraumatic. Neck: Supple, NROM Respiratory: No apparent distress Psych: Normal mood and affect Skin: Pruritic slightly red rashes with yellow crusted spots on arms, neck and face.    Assessment & Plan     1. Rhus dermatitis Started having an itchy rash on arms, face and neck 2  days ago. Benadryl only slight help with itching and no relief from topical treatments. May continue antihistamine prn and add prednisone taper. Recheck if no better in 4-5 days. - predniSONE (DELTASONE) 5 MG tablet; Taper down by 1 tablet by mouth daily starting at 6 day 1, 5 day 2, 4 day 3, 3 day 4, 2 day 5 and 1 day 6. Divide daily dosages among meals and bedtime.  Dispense: 21 tablet; Refill: 0   No follow-ups on file.     I discussed the assessment and treatment plan with the patient. The patient was provided an opportunity to ask questions and all were answered. The patient agreed with the plan and demonstrated an understanding of the instructions.   The patient was advised to call back or seek an in-person evaluation if the symptoms worsen or if the condition fails to improve as anticipated.  I provided 10 minutes of non-face-to-face time during this encounter.  Haywood Pao, PA, have reviewed all documentation for this visit. The documentation on 06/12/20 for the exam, diagnosis, procedures, and orders are all accurate and complete.   Dortha Kern, PA [facility] [phone] (phone) [phone] (fax)  [facility]

## 2020-06-12 NOTE — Telephone Encounter (Signed)
Patient needs office visit for evaluation before any prescriptions can be called in. Please advise patient of this. Also Dr. Sherrie Mustache is out this week so please offer appointment with another provider. "OK" for virtual visit if patient prefers virtual .

## 2020-06-12 NOTE — Telephone Encounter (Signed)
Virtual visit scheduled today with Dortha Kern, PA-C at 4pm. Patient notified and accepted appointment.

## 2020-06-18 NOTE — Progress Notes (Signed)
I,Roshena L Chambers,acting as a scribe for Mila Merryonald Damarien Nyman, MD.,have documented all relevant documentation on the behalf of Mila MerryDonald Jammie Clink, MD,as directed by  Mila Merryonald Lenka Zhao, MD while in the presence of Mila Merryonald Justn Quale, MD.   Complete physical exam   Patient: Valerie Henry   DOB: 1956/03/14   64 y.o. Female  MRN: [id number] Visit Date: 06/19/2020  Today's healthcare provider: Mila Merryonald Virgel Haro, MD   Chief Complaint  Patient presents with  . Annual Exam  . Hyperlipidemia  . Hypertension   Subjective    Valerie Henry is a 64 y.o. female who presents today for a complete physical exam.  She reports consuming a general diet. Home exercise routine includes rides bike 5 days weekly. She generally feels fairly well. She reports sleeping fairly well. She does have additional problems to discuss today.   Last Colonoscopy- 07/03/2016 (repeat in 10 years) Last Pap- 05/28/2015-normal (HPV negative) Last Mammogram- 07/04/2019 (BI-RADS Cat I)  HPI  Hypertension, follow-up  BP Readings from Last 3 Encounters:  06/19/20 138/80  06/17/19 134/82  06/11/18 112/64   Wt Readings from Last 3 Encounters:  06/19/20 158 lb (71.7 kg)  06/17/19 150 lb (68 kg)  06/11/18 161 lb (73 kg)     She was last seen for hypertension 1 year ago.  BP at that visit was 134/82. Management since that visit includes continue same medications.  She reports good compliance with treatment. She is not having side effects.  She is following a Regular diet. She is exercising. She does not smoke.  Use of agents associated with hypertension: NSAIDS.   Outside blood pressures are 121/80. Symptoms: No chest pain No chest pressure  No palpitations No syncope  No dyspnea No orthopnea  No paroxysmal nocturnal dyspnea No lower extremity edema   Pertinent labs: Lab Results  Component Value Date   CHOL 166 06/17/2019   HDL 65 06/17/2019   LDLCALC 85 06/17/2019   TRIG 81 06/17/2019   CHOLHDL 2.6 06/17/2019     Lab Results  Component Value Date   NA 141 06/17/2019   K 4.2 06/17/2019   CREATININE 0.81 06/17/2019   GFRNONAA 78 06/17/2019   GFRAA 89 06/17/2019   GLUCOSE 93 06/17/2019     The 10-year ASCVD risk score Denman George(Goff DC Jr., et al., 2013) is: 6.5%   ---------------------------------------------------------------------------------------------------  Lipid/Cholesterol, Follow-up  Last lipid panel Other pertinent labs  Lab Results  Component Value Date   CHOL 166 06/17/2019   HDL 65 06/17/2019   LDLCALC 85 06/17/2019   TRIG 81 06/17/2019   CHOLHDL 2.6 06/17/2019   Lab Results  Component Value Date   ALT 21 06/17/2019   AST 27 06/17/2019   PLT 276 06/17/2019   TSH 1.400 06/17/2019     She was last seen for this 1 year ago.  Management since that visit includes continuing same medication.  She reports good compliance with treatment. She is not having side effects.   Symptoms: No chest pain No chest pressure/discomfort  No dyspnea No lower extremity edema  No numbness or tingling of extremity No orthopnea  No palpitations No paroxysmal nocturnal dyspnea  No speech difficulty No syncope   Current diet: in general, a "healthy" diet   Current exercise: bicycling  The 10-year ASCVD risk score Denman George(Goff DC Jr., et al., 2013) is: 6.5%  ---------------------------------------------------------------------------------------------------  Rash: Patient complains of an extremely itchy red rash all over her body. She believes it is poison ivy. Patient has taken Prednisone  for rash with no relief. She has also tried taking Benadryl and topical creams. She says the rash has spread and has worsened. Rash appeared 10 days ago after being exposed to poison ivy. She was prescribed 6 days tapering dose starting at 30mg  of prednisone on first day. She states that itching has not improved at all. Some of the original areas of the rash or starting to dry up, but it is cropping in new areas almost  daily. She did not get any relief from prednisone or OTC benadryl cream or tablets. She states she has had similar episodes of poison ivy in the past which tooks weeks to clear up but seems to improve with high doses of prednisone.    Past Medical History:  Diagnosis Date  . Arthritis    thumbs  . Chicken pox   . Measles   . Mumps   . Stroke (HCC) 08/17/14   No deficits   Past Surgical History:  Procedure Laterality Date  . COLONOSCOPY WITH PROPOFOL N/A 07/03/2016   Procedure: COLONOSCOPY WITH PROPOFOL;  Surgeon: 07/05/2016, MD;  Location: [facility];  Service: Endoscopy;  Laterality: N/A;  . TUBAL LIGATION  80's   Social History   Socioeconomic History  . Marital status: Married    Spouse name: Not on file  . Number of children: 2  . Years of education: HS Grad  . Highest education level: Not on file  Occupational History  . Not on file  Tobacco Use  . Smoking status: Never Smoker  . Smokeless tobacco: Never Used  Vaping Use  . Vaping Use: Never used  Substance and Sexual Activity  . Alcohol use: Yes    Alcohol/week: 4.0 standard drinks    Types: 4 Standard drinks or equivalent per week    Comment: 1 glass of wine on weekends  . Drug use: No  . Sexual activity: Not on file  Other Topics Concern  . Not on file  Social History Narrative  . Not on file   Social Determinants of Health   Financial Resource Strain:   . Difficulty of Paying Living Expenses:   Food Insecurity:   . Worried About [facility] in the Last Year:   . Programme researcher, broadcasting/film/video in the Last Year:   Transportation Needs:   . Barista (Medical):   Freight forwarder Lack of Transportation (Non-Medical):   Physical Activity:   . Days of Exercise per Week:   . Minutes of Exercise per Session:   Stress:   . Feeling of Stress :   Social Connections:   . Frequency of Communication with Friends and Family:   . Frequency of Social Gatherings with Friends and Family:   . Attends Religious  Services:   . Active Member of Clubs or Organizations:   . Attends Marland Kitchen Meetings:   Banker Marital Status:   Intimate Partner Violence:   . Fear of Current or Ex-Partner:   . Emotionally Abused:   Marland Kitchen Physically Abused:   . Sexually Abused:    Family Status  Relation Name Status  . Mother  Deceased       Ovarian cancer  . Father  Deceased at age 52       MI  . Brother  Alive       In good health  . Daughter  Alive       In good health  . Son  67  In good health, adopted  . [employer]  Alive  . Neg Hx  (Not Specified)   Family History  Problem Relation Age of Onset  . Ovarian cancer Mother        late 40's  . Heart attack Father   . Dementia Father   . Heart attack Paternal Uncle   . Breast cancer Neg Hx    Allergies  Allergen Reactions  . Lactose Intolerance (Gi)     GI upset    Patient Care Team: Malva Limes, MD as PCP - General (Family Medicine)   Medications: Outpatient Medications Prior to Visit  Medication Sig  . Ascorbic Acid (VITAMIN C PO) Take by mouth daily.  Marland Kitchen aspirin 81 MG tablet Take 1 tablet by mouth daily. Reported on 06/06/2016  . CALCIUM PO Take by mouth daily.  . Cholecalciferol (VITAMIN D PO) Take by mouth daily.  Burman Blacksmith ALLERGY RELIEF, CETIRIZINE, 10 MG tablet Take 1 tablet by mouth once daily  . fluticasone (FLONASE) 50 MCG/ACT nasal spray USE 2 SPRAY(S) IN EACH NOSTRIL ONCE DAILY  . lisinopril-hydrochlorothiazide (ZESTORETIC) 10-12.5 MG tablet Take 1 tablet by mouth once daily  . metoprolol succinate (TOPROL-XL) 50 MG 24 hr tablet TAKE 1 TABLET BY MOUTH ONCE DAILY. TAKE WITH OR IMMEDIATELY FOLLOWING A MEAL  . Multiple Vitamin (MULTIVITAMIN) capsule Take 1 capsule by mouth daily.  Marland Kitchen POTASSIUM PO Take by mouth daily.  . predniSONE (DELTASONE) 5 MG tablet Taper down by 1 tablet by mouth daily starting at 6 day 1, 5 day 2, 4 day 3, 3 day 4, 2 day 5 and 1 day 6. Divide daily dosages among meals and bedtime.  . simvastatin  (ZOCOR) 40 MG tablet TAKE 1 TABLET BY MOUTH AT BEDTIME   No facility-administered medications prior to visit.    Review of Systems  Constitutional: Negative for chills, fatigue and fever.  HENT: Negative for congestion, ear pain, rhinorrhea, sneezing and sore throat.   Eyes: Negative.  Negative for pain and redness.  Respiratory: Negative for cough, shortness of breath and wheezing.   Cardiovascular: Negative for chest pain and leg swelling.  Gastrointestinal: Negative for abdominal pain, blood in stool, constipation, diarrhea and nausea.  Endocrine: Negative for polydipsia and polyphagia.  Genitourinary: Negative.  Negative for dysuria, flank pain, hematuria, pelvic pain, vaginal bleeding and vaginal discharge.  Musculoskeletal: Negative for arthralgias, back pain, gait problem and joint swelling.  Skin: Positive for rash.  Neurological: Negative.  Negative for dizziness, tremors, seizures, weakness, light-headedness, numbness and headaches.  Hematological: Negative for adenopathy.  Psychiatric/Behavioral: Negative.  Negative for behavioral problems, confusion and dysphoric mood. The patient is not nervous/anxious and is not hyperactive.       Objective    BP 138/80 (BP Location: Left Arm, Patient Position: Sitting)   Pulse 71   Temp (!) 97.5 F (36.4 C) (Temporal)   Resp 16   Ht 5\' 3"  (1.6 m)   Wt 158 lb (71.7 kg)   SpO2 98%   BMI 27.99 kg/m    Physical Exam   General Appearance:     Overweight female. Alert, cooperative, in no acute distress, appears stated age   Head:    Normocephalic, without obvious abnormality, atraumatic  Eyes:    PERRL, conjunctiva/corneas clear, EOM's intact, fundi    benign, both eyes  Ears:    Normal TM's and external ear canals, both ears  Nose:   Nares normal, septum midline, mucosa normal, no drainage    or  sinus tenderness  Throat:   Lips, mucosa, and tongue normal; teeth and gums normal  Neck:   Supple, symmetrical, trachea midline, no  adenopathy;    thyroid:  no enlargement/tenderness/nodules; no carotid   bruit or JVD  Back:     Symmetric, no curvature, ROM normal, no CVA tenderness  Lungs:     Clear to auscultation bilaterally, respirations unlabored  Chest Wall:    No tenderness or deformity   Heart:    Normal heart rate. Normal rhythm. No murmurs, rubs, or gallops.   Breast Exam:    normal appearance, no masses or tenderness  Abdomen:     Soft, non-tender, bowel sounds active all four quadrants,    no masses, no organomegaly  Pelvic:    cervix normal in appearance, external genitalia normal, no adnexal masses or tenderness, no cervical motion tenderness and vagina normal without discharge  Extremities:   All extremities are intact. No cyanosis or edema  Pulses:   2+ and symmetric all extremities  Skin:   Scattered patches of vesicular eruptions on erythematous base on anterior arms, neck breast, back buttocks and legs.   Lymph nodes:   Cervical, supraclavicular, and axillary nodes normal  Neurologic:   CNII-XII intact, normal strength, sensation and reflexes    throughout    Last depression screening scores PHQ 2/9 Scores 06/19/2020 06/19/2020 06/17/2019  PHQ - 2 Score 0 0 0  PHQ- 9 Score 0 - 0   Last fall risk screening Fall Risk  06/19/2020  Falls in the past year? 0  Number falls in past yr: 0  Injury with Fall? 0  Follow up Falls evaluation completed   Last Audit-C alcohol use screening Alcohol Use Disorder Test (AUDIT) 06/17/2019  1. How often do you have a drink containing alcohol? 2  2. How many drinks containing alcohol do you have on a typical day when you are drinking? 0  3. How often do you have six or more drinks on one occasion? 0  AUDIT-C Score 2  Alcohol Brief Interventions/Follow-up -   A score of 3 or more in women, and 4 or more in men indicates increased risk for alcohol abuse, EXCEPT if all of the points are from question 1   No results found for any visits on 06/19/20.  Assessment &  Plan    Routine Health Maintenance and Physical Exam  Exercise Activities and Dietary recommendations Goals   None     Immunization History  Administered Date(s) Administered  . Tdap 05/28/2015  . Zoster 05/24/2016    Health Maintenance  Topic Date Due  . COVID-19 Vaccine (1) Never done  . PAP SMEAR-Modifier  05/27/2020  . HIV Screening  06/09/2028 (Originally 02/04/1971)  . INFLUENZA VACCINE  07/08/2020  . MAMMOGRAM  08/03/2021  . TETANUS/TDAP  05/27/2025  . COLONOSCOPY  07/03/2026  . Hepatitis C Screening  Completed    Discussed health benefits of physical activity, and encouraged her to engage in regular exercise appropriate for her age and condition.  1. Annual physical exam   2. Screening for cervical cancer  - Cytology - PAP  3. Pure hypercholesterolemia She is tolerating simvastatin well with no adverse effects.   - CBC - Comprehensive metabolic panel - Lipid panel  4. Cerebrovascular accident, old No residual effects. Asymptomatic. Compliant with medication.  Continue aggressive risk factor modification.    5. Rhus dermatitis She states similar eruption in the past with exposure to poison ivy. Did not have any significant improvement  with 30mg  prednisone taper, but taper was very rapid and original lesions are starting to dry up. For itching will prescription - hydrOXYzine (ATARAX/VISTARIL) 25 MG tablet; Take 1-2 tablets (25-50 mg total) by mouth at bedtime as needed for itching.  Dispense: 30 tablet; Refill: 0 Start higher dose - predniSONE (DELTASONE) 20 MG tablet; Take 1 tablet three times daily for 3 days, then twice daily for 3 days then once daily until gone.  Dispense: 20 tablet; Refill: 0  Call if not rapidly improving on prednisone taper.        The entirety of the information documented in the History of Present Illness, Review of Systems and Physical Exam were personally obtained by me. Portions of this information were initially documented by  the CMA and reviewed by me for thoroughness and accuracy.      , MD  [facility] [phone] (phone) [phone] (fax)  [facility]

## 2020-06-19 ENCOUNTER — Encounter: Payer: Self-pay | Admitting: Family Medicine

## 2020-06-19 ENCOUNTER — Other Ambulatory Visit: Payer: Self-pay

## 2020-06-19 ENCOUNTER — Ambulatory Visit (INDEPENDENT_AMBULATORY_CARE_PROVIDER_SITE_OTHER): Payer: No Typology Code available for payment source | Admitting: Family Medicine

## 2020-06-19 ENCOUNTER — Other Ambulatory Visit (HOSPITAL_COMMUNITY)
Admission: RE | Admit: 2020-06-19 | Discharge: 2020-06-19 | Disposition: A | Discharge status: Home / Self Care | Payer: No Typology Code available for payment source | Source: Ambulatory Visit | Attending: Family Medicine | Admitting: Family Medicine

## 2020-06-19 VITALS — BP 138/80 | HR 71 | Temp 97.5°F | Resp 16 | Ht 63.0 in | Wt 158.0 lb

## 2020-06-19 DIAGNOSIS — E7800 Pure hypercholesterolemia, unspecified: Secondary | ICD-10-CM | POA: Diagnosis not present

## 2020-06-19 DIAGNOSIS — Z124 Encounter for screening for malignant neoplasm of cervix: Secondary | ICD-10-CM

## 2020-06-19 DIAGNOSIS — Z0000 Encounter for general adult medical examination without abnormal findings: Secondary | ICD-10-CM | POA: Diagnosis not present

## 2020-06-19 DIAGNOSIS — Z8673 Personal history of transient ischemic attack (TIA), and cerebral infarction without residual deficits: Secondary | ICD-10-CM

## 2020-06-19 DIAGNOSIS — L255 Unspecified contact dermatitis due to plants, except food: Secondary | ICD-10-CM | POA: Diagnosis not present

## 2020-06-19 MED ORDER — PREDNISONE 20 MG PO TABS: ORAL_TABLET | ORAL | 0 refills | Status: DC

## 2020-06-19 MED ORDER — HYDROXYZINE HCL 25 MG PO TABS: 25.0000 mg | ORAL_TABLET | Freq: Every evening | ORAL | 0 refills | Status: DC | PRN

## 2020-06-20 LAB — COMPREHENSIVE METABOLIC PANEL
ALT: 25 IU/L (ref 0–32)
AST: 22 IU/L (ref 0–40)
Albumin/Globulin Ratio: 2.3 — ABNORMAL HIGH (ref 1.2–2.2)
Albumin: 4.8 g/dL (ref 3.8–4.8)
Alkaline Phosphatase: 117 IU/L (ref 48–121)
BUN/Creatinine Ratio: 21 (ref 12–28)
BUN: 23 mg/dL (ref 8–27)
Bilirubin Total: 0.4 mg/dL (ref 0.0–1.2)
CO2: 24 mmol/L (ref 20–29)
Calcium: 9.6 mg/dL (ref 8.7–10.3)
Chloride: 100 mmol/L (ref 96–106)
Creatinine, Ser: 1.11 mg/dL — ABNORMAL HIGH (ref 0.57–1.00)
GFR calc Af Amer: 61 mL/min/{1.73_m2} (ref >59)
GFR calc non Af Amer: 53 mL/min/{1.73_m2} — ABNORMAL LOW (ref >59)
Globulin, Total: 2.1 g/dL (ref 1.5–4.5)
Glucose: 82 mg/dL (ref 65–99)
Potassium: 4.2 mmol/L (ref 3.5–5.2)
Sodium: 138 mmol/L (ref 134–144)
Total Protein: 6.9 g/dL (ref 6.0–8.5)

## 2020-06-20 LAB — LIPID PANEL
Chol/HDL Ratio: 2.8 ratio (ref 0.0–4.4)
Cholesterol, Total: 201 mg/dL — ABNORMAL HIGH (ref 100–199)
HDL: 72 mg/dL (ref >39)
LDL Chol Calc (NIH): 105 mg/dL — ABNORMAL HIGH (ref 0–99)
Triglycerides: 138 mg/dL (ref 0–149)
VLDL Cholesterol Cal: 24 mg/dL (ref 5–40)

## 2020-06-20 LAB — CBC
Hematocrit: 44.8 % (ref 34.0–46.6)
Hemoglobin: 14.7 g/dL (ref 11.1–15.9)
MCH: 29.1 pg (ref 26.6–33.0)
MCHC: 32.8 g/dL (ref 31.5–35.7)
MCV: 89 fL (ref 79–97)
Platelets: 310 10*3/uL (ref 150–450)
RBC: 5.06 x10E6/uL (ref 3.77–5.28)
RDW: 12.6 % (ref 11.7–15.4)
WBC: 8.7 10*3/uL (ref 3.4–10.8)

## 2020-06-22 LAB — CYTOLOGY - PAP
Comment: NEGATIVE
Diagnosis: NEGATIVE
High risk HPV: NEGATIVE

## 2020-06-29 ENCOUNTER — Other Ambulatory Visit: Payer: Self-pay | Admitting: Family Medicine

## 2020-07-02 ENCOUNTER — Other Ambulatory Visit: Payer: Self-pay | Admitting: Family Medicine

## 2020-07-02 DIAGNOSIS — Z1231 Encounter for screening mammogram for malignant neoplasm of breast: Secondary | ICD-10-CM

## 2020-08-22 ENCOUNTER — Other Ambulatory Visit: Payer: Self-pay

## 2020-08-22 ENCOUNTER — Ambulatory Visit
Admission: RE | Admit: 2020-08-22 | Discharge: 2020-08-22 | Disposition: A | Discharge status: Home / Self Care | Payer: PRIVATE HEALTH INSURANCE | Source: Ambulatory Visit | Attending: Family Medicine | Admitting: Family Medicine

## 2020-08-22 DIAGNOSIS — Z1231 Encounter for screening mammogram for malignant neoplasm of breast: Secondary | ICD-10-CM | POA: Diagnosis not present

## 2020-10-15 ENCOUNTER — Other Ambulatory Visit: Payer: Self-pay | Admitting: Family Medicine

## 2020-11-20 ENCOUNTER — Other Ambulatory Visit: Payer: Self-pay | Admitting: Family Medicine

## 2021-01-01 ENCOUNTER — Encounter: Payer: Self-pay | Admitting: Family Medicine

## 2021-01-01 ENCOUNTER — Telehealth (INDEPENDENT_AMBULATORY_CARE_PROVIDER_SITE_OTHER): Payer: No Typology Code available for payment source | Admitting: Family Medicine

## 2021-01-01 VITALS — Wt 158.0 lb

## 2021-01-01 DIAGNOSIS — H6981 Other specified disorders of Eustachian tube, right ear: Secondary | ICD-10-CM

## 2021-01-01 DIAGNOSIS — J0140 Acute pansinusitis, unspecified: Secondary | ICD-10-CM | POA: Diagnosis not present

## 2021-01-01 MED ORDER — AMOXICILLIN-POT CLAVULANATE 875-125 MG PO TABS: 1.0000 | ORAL_TABLET | Freq: Two times a day (BID) | ORAL | 0 refills | Status: AC

## 2021-01-01 NOTE — Progress Notes (Signed)
Virtual telephone visit    Virtual Visit via Telephone Note   This visit type was conducted due to national recommendations for restrictions regarding the COVID-19 Pandemic (e.g. social distancing) in an effort to limit this patient's exposure and mitigate transmission in our community. Due to her co-morbid illnesses, this patient is at least at moderate risk for complications without adequate follow up. This format is felt to be most appropriate for this patient at this time. The patient did not have access to video technology or had technical difficulties with video requiring transitioning to audio format only (telephone). Physical exam was limited to content and character of the telephone converstion.     Patient location: home Provider location: home office Persons involved in the visit: patient, provider   I discussed the limitations of evaluation and management by telemedicine and the availability of in person appointments. The patient expressed understanding and agreed to proceed.   Visit Date: 01/01/2021  Today's healthcare provider: Shirlee Latch, MD   Chief Complaint  Patient presents with  . URI   Subjective    HPI HPI    URI    Associated symptoms inlclude congestion, ear pain, headache, plugged ear sensation and sinus pain.  Recent episode started in the past 7 days.  The problem has been gradually worsening since onset.  The temperature has been with in normal range.  Patient  is drinking plenty of fluids.  Past hisotry is significant for  no history of pneumonia or bronchitis.  Patient is not a smoker.       Last edited by Myles Lipps, CMA on 01/01/2021  1:31 PM. (History)      Symptoms started 1 wk ago.  Ears are feeling worse. Not tested for COVID. Not vaccinated against COVID or flu.  No known sick contacts. Taking antihistamine and flonse daily.  Patient Active Problem List   Diagnosis Date Noted  . Family history of ovarian cancer  06/09/2017  . Strain of right knee 05/30/2016  . Allergic rhinitis, seasonal 04/12/2015  . Essential (primary) hypertension 04/12/2015  . HLD (hyperlipidemia) 04/12/2015  . Cerebrovascular accident, old 08/17/2014   Social History   Tobacco Use  . Smoking status: Never Smoker  . Smokeless tobacco: Never Used  Vaping Use  . Vaping Use: Never used  Substance Use Topics  . Alcohol use: Yes    Alcohol/week: 4.0 standard drinks    Types: 4 Standard drinks or equivalent per week    Comment: 1 glass of wine on weekends  . Drug use: No   Allergies  Allergen Reactions  . Lactose Intolerance (Gi)     GI upset      Medications: Outpatient Medications Prior to Visit  Medication Sig  . Ascorbic Acid (VITAMIN C PO) Take by mouth daily.  Marland Kitchen aspirin 81 MG tablet Take 1 tablet by mouth daily. Reported on 06/06/2016  . CALCIUM PO Take by mouth daily.  . cetirizine (ZYRTEC) 10 MG tablet Take 1 tablet by mouth once daily  . Cholecalciferol (VITAMIN D PO) Take by mouth daily.  . fluticasone (FLONASE) 50 MCG/ACT nasal spray USE 2 SPRAY(S) IN EACH NOSTRIL ONCE DAILY  . hydrOXYzine (ATARAX/VISTARIL) 25 MG tablet Take 1-2 tablets (25-50 mg total) by mouth at bedtime as needed for itching.  Marland Kitchen lisinopril-hydrochlorothiazide (ZESTORETIC) 10-12.5 MG tablet Take 1 tablet by mouth once daily  . metoprolol succinate (TOPROL-XL) 50 MG 24 hr tablet Take 1 tablet (50 mg total) by mouth daily.  . Multiple  Vitamin (MULTIVITAMIN) capsule Take 1 capsule by mouth daily.  Marland Kitchen POTASSIUM PO Take by mouth daily.  . simvastatin (ZOCOR) 40 MG tablet Take 1 tablet (40 mg total) by mouth every evening.  . [DISCONTINUED] predniSONE (DELTASONE) 20 MG tablet Take 1 tablet three times daily for 3 days, then twice daily for 3 days then once daily until gone.   No facility-administered medications prior to visit.    Review of Systems  Constitutional: Negative for activity change, appetite change, chills and fever.  HENT:  Positive for congestion, ear pain, sinus pressure and sinus pain.   Respiratory: Negative for cough, chest tightness, shortness of breath and wheezing.   Cardiovascular: Negative for chest pain and palpitations.    Last CBC Lab Results  Component Value Date   WBC 8.7 06/19/2020   HGB 14.7 06/19/2020   HCT 44.8 06/19/2020   MCV 89 06/19/2020   MCH 29.1 06/19/2020   RDW 12.6 06/19/2020   PLT 310 06/19/2020   Last metabolic panel Lab Results  Component Value Date   GLUCOSE 82 06/19/2020   NA 138 06/19/2020   K 4.2 06/19/2020   CL 100 06/19/2020   CO2 24 06/19/2020   BUN 23 06/19/2020   CREATININE 1.11 (H) 06/19/2020   GFRNONAA 53 (L) 06/19/2020   GFRAA 61 06/19/2020   CALCIUM 9.6 06/19/2020   PHOS 4.0 05/28/2015   PROT 6.9 06/19/2020   ALBUMIN 4.8 06/19/2020   LABGLOB 2.1 06/19/2020   AGRATIO 2.3 (H) 06/19/2020   BILITOT 0.4 06/19/2020   ALKPHOS 117 06/19/2020   AST 22 06/19/2020   ALT 25 06/19/2020   ANIONGAP 7 08/18/2014      Objective    Wt 158 lb (71.7 kg)   BMI 27.99 kg/m  BP Readings from Last 3 Encounters:  06/19/20 138/80  06/17/19 134/82  06/11/18 112/64   Wt Readings from Last 3 Encounters:  01/01/21 158 lb (71.7 kg)  06/19/20 158 lb (71.7 kg)  06/17/19 150 lb (68 kg)     Speaking in full sentences with no resp distress   Assessment & Plan     1. Acute non-recurrent pansinusitis 2. Dysfunction of right eustachian tube - symptoms c/w sinusitis with eustachian tube dysfunction - no evidence of AOM, CAP, strep pharyngitis, or other infection - given duration of symptoms, suspect bacterial etiology - will also test for COVID, though and discussed isolation - will treat with augmentin x7d - discussed symptomatic management (flonase, decongestants, etc), natural course, and return precautions   - Novel Coronavirus, NAA ([facility])  Meds ordered this encounter  Medications  . amoxicillin-clavulanate (AUGMENTIN) 875-125 MG tablet    Sig: Take 1  tablet by mouth 2 (two) times daily for 7 days.    Dispense:  14 tablet    Refill:  0     Return if symptoms worsen or fail to improve.    I discussed the assessment and treatment plan with the patient. The patient was provided an opportunity to ask questions and all were answered. The patient agreed with the plan and demonstrated an understanding of the instructions.   The patient was advised to call back or seek an in-person evaluation if the symptoms worsen or if the condition fails to improve as anticipated.  I provided 14 minutes of non-face-to-face time during this encounter.  I, Shirlee Latch, MD, have reviewed all documentation for this visit. The documentation on 01/01/21 for the exam, diagnosis, procedures, and orders are all accurate and complete.   Byan Poplaski,  Dionne Bucy, MD, MPH [facility]

## 2021-01-01 NOTE — Patient Instructions (Signed)

## 2021-01-03 LAB — SARS-COV-2, NAA 2 DAY TAT

## 2021-01-03 LAB — NOVEL CORONAVIRUS, NAA: SARS-CoV-2, NAA: NOT DETECTED

## 2021-01-09 ENCOUNTER — Telehealth: Payer: Self-pay | Admitting: Family Medicine

## 2021-01-09 DIAGNOSIS — L255 Unspecified contact dermatitis due to plants, except food: Secondary | ICD-10-CM

## 2021-01-09 NOTE — Telephone Encounter (Signed)
Pt had video visit with dr b on 01-01-2021 and she finished abx yesterday and still having right ear pain. Patient stated she had ear pain before and was given prednisone 10 mg and that clear ear pain up. Please advise. [facility] -[address] in [place]

## 2021-01-10 MED ORDER — PREDNISONE 10 MG PO TABS: ORAL_TABLET | ORAL | 0 refills | Status: AC

## 2021-01-10 NOTE — Telephone Encounter (Signed)
Patient advised.

## 2021-01-10 NOTE — Telephone Encounter (Signed)
Have sent prescription to [facility]. Need in-office visit if not better when finished.

## 2021-04-01 NOTE — Progress Notes (Signed)
Virtual telephone visit    Virtual Visit via Telephone Note   This visit type was conducted due to national recommendations for restrictions regarding the COVID-19 Pandemic (e.g. social distancing) in an effort to limit this patient's exposure and mitigate transmission in our community. Due to her co-morbid illnesses, this patient is at least at moderate risk for complications without adequate follow up. This format is felt to be most appropriate for this patient at this time. The patient did not have access to video technology or had technical difficulties with video requiring transitioning to audio format only (telephone). Physical exam was limited to content and character of the telephone converstion.    Patient location: Work Provider location: Office  I discussed the limitations of evaluation and management by telemedicine and the availability of in person appointments. The patient expressed understanding and agreed to proceed.   Visit Date: 04/02/2021  Today's healthcare provider: Dortha Kern, PA-C   Chief Complaint  Patient presents with  . Sinus Problem   Subjective    HPI   Sinus Problem This is a new problem. The current episode started 1 to 4 weeks ago. The problem has been gradually worsening since onset. There has been no fever. Associated symptoms include coughing, ear pain, headaches, neck pain and sinus pressure. Pertinent negatives include no chills, congestion, diaphoresis, hoarse voice, shortness of breath, sneezing, sore throat or swollen glands. Treatments tried: Coricidan Hbp. The treatment provided no relief.   Past Medical History:  Diagnosis Date  . Arthritis    thumbs  . Chicken pox   . Measles   . Mumps   . Stroke (HCC) 08/17/14   No deficits   Past Surgical History:  Procedure Laterality Date  . COLONOSCOPY WITH PROPOFOL N/A 07/03/2016   Procedure: COLONOSCOPY WITH PROPOFOL;  Surgeon: [name], MD;  Location: [facility];   Service: Endoscopy;  Laterality: N/A;  . TUBAL LIGATION  80's   Social History   Tobacco Use  . Smoking status: Never Smoker  . Smokeless tobacco: Never Used  Vaping Use  . Vaping Use: Never used  Substance Use Topics  . Alcohol use: Yes    Alcohol/week: 4.0 standard drinks    Types: 4 Standard drinks or equivalent per week    Comment: 1 glass of wine on weekends  . Drug use: No   Family Status  Relation Name Status  . Mother  Deceased       Ovarian cancer  . Father  Deceased at age 4       MI  . Brother  Alive       In good health  . Daughter  Alive       In good health  . Son  Alive       In good health, adopted  . [employer]  Alive  . Neg Hx  (Not Specified)   Allergies  Allergen Reactions  . Lactose Intolerance (Gi)     GI upset      Medications: Outpatient Medications Prior to Visit  Medication Sig  . Ascorbic Acid (VITAMIN C PO) Take by mouth daily.  Marland Kitchen aspirin 81 MG tablet Take 1 tablet by mouth daily. Reported on 06/06/2016  . CALCIUM PO Take by mouth daily.  . cetirizine (ZYRTEC) 10 MG tablet Take 1 tablet by mouth once daily  . Cholecalciferol (VITAMIN D PO) Take by mouth daily.  . fluticasone (FLONASE) 50 MCG/ACT nasal spray USE 2 SPRAY(S) IN EACH NOSTRIL ONCE DAILY  .  hydrOXYzine (ATARAX/VISTARIL) 25 MG tablet Take 1-2 tablets (25-50 mg total) by mouth at bedtime as needed for itching.  Marland Kitchen lisinopril-hydrochlorothiazide (ZESTORETIC) 10-12.5 MG tablet Take 1 tablet by mouth once daily  . metoprolol succinate (TOPROL-XL) 50 MG 24 hr tablet Take 1 tablet (50 mg total) by mouth daily.  . Multiple Vitamin (MULTIVITAMIN) capsule Take 1 capsule by mouth daily.  Marland Kitchen POTASSIUM PO Take by mouth daily.  . simvastatin (ZOCOR) 40 MG tablet Take 1 tablet (40 mg total) by mouth every evening.   No facility-administered medications prior to visit.   Review of Systems  Constitutional: Negative.   HENT: Positive for sinus pain.   Eyes: Negative.   Respiratory:  Negative.        Objective    There were no vitals taken for this visit.  No respiratory distress during telephonic interview.   Assessment & Plan     1. Subacute maxillary sinusitis Developed upper respiratory congestion 3-4 weeks ago. Has progressed to sinus pressure with headache and PND. No relief from Corcidin. Will give antibiotic and recommend expectorant (Mucinex). Increase fluids and recheck prn. Denies COVID symptoms or vaccination. - amoxicillin (AMOXIL) 875 MG tablet; Take 1 tablet (875 mg total) by mouth 2 (two) times daily for 10 days.  Dispense: 20 tablet; Refill: 0  2. Seasonal allergic rhinitis due to pollen Still using Flonase and Zyrtec regularly for allergic rhinitis.  No follow-ups on file.    I discussed the assessment and treatment plan with the patient. The patient was provided an opportunity to ask questions and all were answered. The patient agreed with the plan and demonstrated an understanding of the instructions.   The patient was advised to call back or seek an in-person evaluation if the symptoms worsen or if the condition fails to improve as anticipated.  I provided 20 minutes of non-face-to-face time during this encounter.  I, Johanny Segers, PA-C, have reviewed all documentation for this visit. The documentation on 04/02/21 for the exam, diagnosis, procedures, and orders are all accurate and complete.   Dortha Kern, PA-C [employer] [phone] (phone) [phone] (fax)  [facility]

## 2021-04-02 ENCOUNTER — Encounter: Payer: Self-pay | Admitting: Family Medicine

## 2021-04-02 ENCOUNTER — Telehealth (INDEPENDENT_AMBULATORY_CARE_PROVIDER_SITE_OTHER): Payer: No Typology Code available for payment source | Admitting: Family Medicine

## 2021-04-02 DIAGNOSIS — J301 Allergic rhinitis due to pollen: Secondary | ICD-10-CM | POA: Diagnosis not present

## 2021-04-02 DIAGNOSIS — J0100 Acute maxillary sinusitis, unspecified: Secondary | ICD-10-CM

## 2021-04-02 MED ORDER — AMOXICILLIN 875 MG PO TABS: 875.0000 mg | ORAL_TABLET | Freq: Two times a day (BID) | ORAL | 0 refills | Status: AC

## 2021-05-23 ENCOUNTER — Telehealth: Payer: Self-pay

## 2021-05-23 DIAGNOSIS — H6690 Otitis media, unspecified, unspecified ear: Secondary | ICD-10-CM

## 2021-05-23 MED ORDER — AZITHROMYCIN 250 MG PO TABS: ORAL_TABLET | ORAL | 0 refills | Status: AC

## 2021-05-23 NOTE — Telephone Encounter (Signed)
Copied from CRM [id number]. Topic: General - Other >> May 23, 2021  9:10 AM Jaquita Rector A wrote: ,Reason for CRM: Patient called in to inform Dr Sherrie Mustache that she was seen at a walk in clinic on last week say that she have cough, congestion and her right ear is totally stopped up say that her Rx from the walk in runs out today and she is still not better. Patient say she would appreciate if she can get something done before the weekend please. Call at  Ph# [phone]

## 2021-05-23 NOTE — Telephone Encounter (Signed)
Pt advised.  She states her Covid test was negative.   Thanks,   -Vernona Rieger

## 2021-05-23 NOTE — Telephone Encounter (Signed)
Have sent prescription for azithromycin to [facility]. Also, if she hasn't done a Covid test she should take one.

## 2021-05-28 MED ORDER — PREDNISONE 10 MG PO TABS: ORAL_TABLET | ORAL | 0 refills | Status: AC

## 2021-05-28 NOTE — Addendum Note (Signed)
Addended by: Malva Limes on: 05/28/2021 12:51 PM   Modules accepted: Orders

## 2021-05-28 NOTE — Telephone Encounter (Signed)
Pt called asking if she can get a Rx for Prednisone 10 mgs for 21 days.  She took that a couple of years ago and it took care of her ear pain and congestion.  She called [facility] but they can not see her until July.  Donivan Scull [place]  CB#  [phone]

## 2021-06-21 ENCOUNTER — Other Ambulatory Visit: Payer: Self-pay

## 2021-06-21 ENCOUNTER — Encounter: Payer: Self-pay | Admitting: Family Medicine

## 2021-06-21 ENCOUNTER — Ambulatory Visit (INDEPENDENT_AMBULATORY_CARE_PROVIDER_SITE_OTHER): Payer: No Typology Code available for payment source | Admitting: Family Medicine

## 2021-06-21 VITALS — BP 159/96 | HR 67 | Temp 98.1°F | Ht 63.0 in | Wt 155.0 lb

## 2021-06-21 DIAGNOSIS — I10 Essential (primary) hypertension: Secondary | ICD-10-CM | POA: Diagnosis not present

## 2021-06-21 DIAGNOSIS — Z23 Encounter for immunization: Secondary | ICD-10-CM | POA: Diagnosis not present

## 2021-06-21 DIAGNOSIS — Z0000 Encounter for general adult medical examination without abnormal findings: Secondary | ICD-10-CM | POA: Diagnosis not present

## 2021-06-21 DIAGNOSIS — E2839 Other primary ovarian failure: Secondary | ICD-10-CM | POA: Diagnosis not present

## 2021-06-21 DIAGNOSIS — E7800 Pure hypercholesterolemia, unspecified: Secondary | ICD-10-CM

## 2021-06-21 MED ORDER — VALSARTAN-HYDROCHLOROTHIAZIDE 160-12.5 MG PO TABS: 1.0000 | ORAL_TABLET | Freq: Every day | ORAL | 2 refills | Status: DC

## 2021-06-21 NOTE — Patient Instructions (Addendum)
Please review the attached list of medications and notify my office if there are any errors.   Please go to the lab draw station in [address] on the second floor of [facility]  when you are fasting for 8 hours. Normal hours are 8:00am to 11:30am and 1:00pm to 4:00pm Monday through Friday   I recommend that you get the Prevnar-20 vaccine to protect yourself from certain strains of pneumonia. Please call our office at [phone] at your earliest convenience to schedule this vaccine.   The CDC recommends two doses of Shingrix (the shingles vaccine) separated by 2 to 6 months for adults age 37 years and older. I recommend checking with your pharmacy plan regarding coverage for this vaccine.

## 2021-06-21 NOTE — Progress Notes (Signed)
Complete physical exam   Patient: Valerie Henry   DOB: Dec 23, 1955   65 y.o. Female  MRN: [id number] Visit Date: 06/21/2021  Today's healthcare provider: Mila Merryonald Kaylub Detienne, MD   Chief Complaint  Patient presents with   Annual Exam   Hyperlipidemia   Subjective    Valerie LimboCheryl M Mccauley is a 65 y.o. female who presents today for a complete physical exam.  She reports consuming a general diet. Home exercise routine includes cardio. She generally feels fairly well. She reports sleeping fairly well. She does not have additional problems to discuss today.  HPI  Lipid/Cholesterol, Follow-up  Last lipid panel Other pertinent labs  Lab Results  Component Value Date   CHOL 201 (H) 06/19/2020   HDL 72 06/19/2020   LDLCALC 105 (H) 06/19/2020   TRIG 138 06/19/2020   CHOLHDL 2.8 06/19/2020   Lab Results  Component Value Date   ALT 25 06/19/2020   AST 22 06/19/2020   PLT 310 06/19/2020   TSH 1.400 06/17/2019     She was last seen for this 1  year  ago.  Management since that visit includes continuing same medication.  She reports good compliance with treatment. She is not having side effects.   Symptoms: No chest pain No chest pressure/discomfort  No dyspnea No lower extremity edema  No numbness or tingling of extremity No orthopnea  No palpitations No paroxysmal nocturnal dyspnea  No speech difficulty No syncope   Current diet: well balanced Current exercise:  cardio 4 times a week  The 10-year ASCVD risk score Denman George(Goff DC Jr., et al., 2013) is: 10.2%  ---------------------------------------------------------------------------------------------------   Hypertension, follow-up  BP Readings from Last 3 Encounters:  06/21/21 (!) 159/96  06/19/20 138/80  06/17/19 134/82   Wt Readings from Last 3 Encounters:  06/21/21 155 lb (70.3 kg)  01/01/21 158 lb (71.7 kg)  06/19/20 158 lb (71.7 kg)     She was last seen for hypertension 1  year  ago.  BP at that visit was 138/80.  Management since that visit includes continue same medication.  She reports good compliance with treatment. She is not having side effects.  She is following a Regular diet. She is exercising. She does not smoke.  Use of agents associated with hypertension: NSAIDS.   Outside blood pressures are 120/78. Symptoms: No chest pain No chest pressure  No palpitations No syncope  No dyspnea No orthopnea  No paroxysmal nocturnal dyspnea No lower extremity edema   Pertinent labs: Lab Results  Component Value Date   CHOL 201 (H) 06/19/2020   HDL 72 06/19/2020   LDLCALC 105 (H) 06/19/2020   TRIG 138 06/19/2020   CHOLHDL 2.8 06/19/2020   Lab Results  Component Value Date   NA 138 06/19/2020   K 4.2 06/19/2020   CREATININE 1.11 (H) 06/19/2020   GFRNONAA 53 (L) 06/19/2020   GFRAA 61 06/19/2020   GLUCOSE 82 06/19/2020     The 10-year ASCVD risk score Denman George(Goff DC Jr., et al., 2013) is: 10.2%   ---------------------------------------------------------------------------------------------------   Past Medical History:  Diagnosis Date   Arthritis    thumbs   Chicken pox    Measles    Mumps    Stroke (HCC) 08/17/14   No deficits   Past Surgical History:  Procedure Laterality Date   COLONOSCOPY WITH PROPOFOL N/A 07/03/2016   Procedure: COLONOSCOPY WITH PROPOFOL;  Surgeon: Midge Miniumarren Wohl, MD;  [facility];  Service: Endoscopy;  Laterality: N/A;  TUBAL LIGATION  80's   Social History   Socioeconomic History   Marital status: Married    Spouse name: Not on file   Number of children: 2   Years of education: HS Grad   Highest education level: Not on file  Occupational History   Not on file  Tobacco Use   Smoking status: Never   Smokeless tobacco: Never  Vaping Use   Vaping Use: Never used  Substance and Sexual Activity   Alcohol use: Yes    Alcohol/week: 4.0 standard drinks    Types: 4 Standard drinks or equivalent per week    Comment: 1 glass of wine on  weekends   Drug use: No   Sexual activity: Not on file  Other Topics Concern   Not on file  Social History Narrative   Not on file   Social Determinants of Health   Financial Resource Strain: Not on file  Food Insecurity: Not on file  Transportation Needs: Not on file  Physical Activity: Not on file  Stress: Not on file  Social Connections: Not on file  Intimate Partner Violence: Not on file   Family Status  Relation Name Status   Mother  Deceased       Ovarian cancer   Father  Deceased at age 30       MI   Brother  Alive       In good health   Daughter  Alive       In good health   Son  Alive       In good health, adopted   Nutritional therapist  Alive   Neg Hx  (Not Specified)   Family History  Problem Relation Age of Onset   Ovarian cancer Mother        late 26's   Heart attack Father    Dementia Father    Heart attack Paternal Uncle    Breast cancer Neg Hx    Allergies  Allergen Reactions   Lactose Intolerance (Gi)     GI upset    Patient Care Team: Malva Limes, MD as PCP - General (Family Medicine)   Medications: Outpatient Medications Prior to Visit  Medication Sig   Ascorbic Acid (VITAMIN C PO) Take by mouth daily.   aspirin 81 MG tablet Take 1 tablet by mouth daily. Reported on 06/06/2016   CALCIUM PO Take by mouth daily.   cetirizine (ZYRTEC) 10 MG tablet Take 1 tablet by mouth once daily   Cholecalciferol (VITAMIN D PO) Take by mouth daily.   fluticasone (FLONASE) 50 MCG/ACT nasal spray USE 2 SPRAY(S) IN EACH NOSTRIL ONCE DAILY   hydrOXYzine (ATARAX/VISTARIL) 25 MG tablet Take 1-2 tablets (25-50 mg total) by mouth at bedtime as needed for itching.   levofloxacin (LEVAQUIN) 750 MG tablet Take 750 mg by mouth daily.   lisinopril-hydrochlorothiazide (ZESTORETIC) 10-12.5 MG tablet Take 1 tablet by mouth once daily   metoprolol succinate (TOPROL-XL) 50 MG 24 hr tablet Take 1 tablet (50 mg total) by mouth daily.   Multiple Vitamin (MULTIVITAMIN) capsule Take  1 capsule by mouth daily.   POTASSIUM PO Take by mouth daily.   predniSONE (STERAPRED UNI-PAK 48 TAB) 10 MG (48) TBPK tablet Take by mouth as directed. 10 day tapered dose   simvastatin (ZOCOR) 40 MG tablet Take 1 tablet (40 mg total) by mouth every evening.   No facility-administered medications prior to visit.    Review of Systems  Constitutional:  Negative for chills,  fatigue and fever.  HENT:  Positive for congestion. Negative for ear pain, rhinorrhea, sneezing and sore throat.   Eyes: Negative.  Negative for pain and redness.  Respiratory:  Negative for cough, shortness of breath and wheezing.   Cardiovascular:  Negative for chest pain and leg swelling.  Gastrointestinal:  Negative for abdominal pain, blood in stool, constipation, diarrhea and nausea.  Endocrine: Negative for polydipsia and polyphagia.  Genitourinary: Negative.  Negative for dysuria, flank pain, hematuria, pelvic pain, vaginal bleeding and vaginal discharge.  Musculoskeletal:  Negative for arthralgias, back pain, gait problem and joint swelling.  Skin:  Negative for rash.  Neurological: Negative.  Negative for dizziness, tremors, seizures, weakness, light-headedness, numbness and headaches.  Hematological:  Negative for adenopathy.  Psychiatric/Behavioral: Negative.  Negative for behavioral problems, confusion and dysphoric mood. The patient is not nervous/anxious and is not hyperactive.      Objective    BP (!) 159/96 (BP Location: Right Arm, Patient Position: Sitting, Cuff Size: Normal)   Pulse 67   Temp 98.1 F (36.7 C) (Temporal)   Ht 5\' 3"  (1.6 m)   Wt 155 lb (70.3 kg)   BMI 27.46 kg/m    Physical Exam   General Appearance:     Well developed, well nourished female. Alert, cooperative, in no acute distress, appears stated age   Head:    Normocephalic, without obvious abnormality, atraumatic  Eyes:    PERRL, conjunctiva/corneas clear, EOM's intact, fundi    benign, both eyes  Ears:    Canals clear,  Right TM bulging.   Neck:   Supple, symmetrical, trachea midline, no adenopathy;    thyroid:  no enlargement/tenderness/nodules; no carotid   bruit or JVD  Back:     Symmetric, no curvature, ROM normal, no CVA tenderness  Lungs:     Clear to auscultation bilaterally, respirations unlabored  Chest Wall:    No tenderness or deformity   Heart:    Normal heart rate. Normal rhythm. No murmurs, rubs, or gallops.    Breast Exam:    normal appearance, no masses or tenderness  Abdomen:     Soft, non-tender, bowel sounds active all four quadrants,    no masses, no organomegaly  Pelvic:    not indicated; post-menopausal, no abnormal Pap smears in past  Extremities:   All extremities are intact. No cyanosis or edema  Pulses:   2+ and symmetric all extremities  Skin:   Skin color, texture, turgor normal, no rashes or lesions  Lymph nodes:   Cervical, supraclavicular, and axillary nodes normal  Neurologic:   CNII-XII intact, normal strength, sensation and reflexes    throughout     Last depression screening scores PHQ 2/9 Scores 06/21/2021 06/19/2020 06/19/2020  PHQ - 2 Score 0 0 0  PHQ- 9 Score - 0 -   Last fall risk screening Fall Risk  06/21/2021  Falls in the past year? 0  Number falls in past yr: 0  Injury with Fall? 0  Risk for fall due to : No Fall Risks  Follow up Falls evaluation completed   Last Audit-C alcohol use screening Alcohol Use Disorder Test (AUDIT) 06/21/2021  1. How often do you have a drink containing alcohol? 2  2. How many drinks containing alcohol do you have on a typical day when you are drinking? 0  3. How often do you have six or more drinks on one occasion? 0  AUDIT-C Score 2  Alcohol Brief Interventions/Follow-up -   A score of  3 or more in women, and 4 or more in men indicates increased risk for alcohol abuse, EXCEPT if all of the points are from question 1   No results found for any visits on 06/21/21.  Assessment & Plan    Routine Health Maintenance and  Physical Exam  Exercise Activities and Dietary recommendations  Goals   None     Immunization History  Administered Date(s) Administered   Tdap 05/28/2015   Zoster, Live 05/24/2016    Health Maintenance  Topic Date Due   COVID-19 Vaccine (1) Never done   Zoster Vaccines- Shingrix (1 of 2) Never done   DEXA SCAN  Never done   PNA vac Low Risk Adult (1 of 2 - PCV13) Never done   HIV Screening  06/09/2028 (Originally 02/04/1971)   INFLUENZA VACCINE  07/08/2021   MAMMOGRAM  08/22/2022   TETANUS/TDAP  05/27/2025   PAP SMEAR-Modifier  06/19/2025   COLONOSCOPY (Pts 45-2yrs Insurance coverage will need to be confirmed)  07/03/2026   Hepatitis C Screening  Completed   HPV VACCINES  Aged Out    1. Annual physical exam Generally doing well unremarkable exam aside from bulging right ear drum for which she is being treated by ENT  2. Estrogen deficiency  - DG Bone density [place]; Future  3. Need for shingles vaccine She declined today due to being on prednisone.   4. Need for vaccination against Streptococcus pneumoniae She declined today due to being on prednisone.   5. Essential (primary) hypertension She states she has had chronic cough and her ENT suggested she try changing lisinopril to another medications. BP is up today but she attributes prednisone to recent increasing in blood pressure.   Change lisinopril-hctz to  valsartan-hydrochlorothiazide (DIOVAN-HCT) 160-12.5 MG tablet; Take 1 tablet by mouth daily.  Dispense: 30 tablet; Refill: 2  - CBC  6. Pure hypercholesterolemia She is tolerating atorvastatin well with no adverse effects.   - Comprehensive metabolic panel - Lipid panel    The entirety of the information documented in the History of Present Illness, Review of Systems and Physical Exam were personally obtained by me. Portions of this information were initially documented by the CMA and reviewed by me for thoroughness and accuracy.     Mila Merry,  MD  [facility] [phone] (phone) [phone] (fax)  [facility]

## 2021-06-26 LAB — COMPREHENSIVE METABOLIC PANEL
ALT: 28 IU/L (ref 0–32)
AST: 18 IU/L (ref 0–40)
Albumin/Globulin Ratio: 2.1 (ref 1.2–2.2)
Albumin: 4.7 g/dL (ref 3.8–4.8)
Alkaline Phosphatase: 99 IU/L (ref 44–121)
BUN/Creatinine Ratio: 28 (ref 12–28)
BUN: 27 mg/dL (ref 8–27)
Bilirubin Total: 0.4 mg/dL (ref 0.0–1.2)
CO2: 21 mmol/L (ref 20–29)
Calcium: 9.9 mg/dL (ref 8.7–10.3)
Chloride: 101 mmol/L (ref 96–106)
Creatinine, Ser: 0.97 mg/dL (ref 0.57–1.00)
Globulin, Total: 2.2 g/dL (ref 1.5–4.5)
Glucose: 101 mg/dL — ABNORMAL HIGH (ref 65–99)
Potassium: 4.5 mmol/L (ref 3.5–5.2)
Sodium: 140 mmol/L (ref 134–144)
Total Protein: 6.9 g/dL (ref 6.0–8.5)
eGFR: 65 mL/min/{1.73_m2} (ref >59)

## 2021-06-26 LAB — LIPID PANEL
Chol/HDL Ratio: 2.6 ratio (ref 0.0–4.4)
Cholesterol, Total: 216 mg/dL — ABNORMAL HIGH (ref 100–199)
HDL: 82 mg/dL (ref >39)
LDL Chol Calc (NIH): 113 mg/dL — ABNORMAL HIGH (ref 0–99)
Triglycerides: 122 mg/dL (ref 0–149)
VLDL Cholesterol Cal: 21 mg/dL (ref 5–40)

## 2021-06-26 LAB — CBC
Hematocrit: 42 % (ref 34.0–46.6)
Hemoglobin: 14 g/dL (ref 11.1–15.9)
MCH: 28.9 pg (ref 26.6–33.0)
MCHC: 33.3 g/dL (ref 31.5–35.7)
MCV: 87 fL (ref 79–97)
Platelets: 517 10*3/uL — ABNORMAL HIGH (ref 150–450)
RBC: 4.84 x10E6/uL (ref 3.77–5.28)
RDW: 13.7 % (ref 11.7–15.4)
WBC: 17.6 10*3/uL — ABNORMAL HIGH (ref 3.4–10.8)

## 2021-07-09 ENCOUNTER — Other Ambulatory Visit: Payer: Self-pay

## 2021-07-09 DIAGNOSIS — I10 Essential (primary) hypertension: Secondary | ICD-10-CM

## 2021-07-09 MED ORDER — VALSARTAN-HYDROCHLOROTHIAZIDE 160-12.5 MG PO TABS: 1.0000 | ORAL_TABLET | Freq: Every day | ORAL | 4 refills | Status: DC

## 2021-07-09 NOTE — Telephone Encounter (Signed)
Copied from CRM [id number]. Topic: General - Other >> Jul 05, 2021  9:21 AM Valerie Henry wrote: Reason for CRM: Patient has called to share that they feel their prescription for valsartan-hydrochlorothiazide (DIOVAN-HCT) 160-12.5 MG tablet  is very effective  The patient shares that, as previously discussed with their PCP, they would like to be prescribed Henry 90 day supply of the medication  Please contact further when possible

## 2021-07-31 ENCOUNTER — Telehealth: Payer: Self-pay | Admitting: Family Medicine

## 2021-07-31 DIAGNOSIS — D75839 Thrombocytosis, unspecified: Secondary | ICD-10-CM

## 2021-07-31 DIAGNOSIS — D72829 Elevated white blood cell count, unspecified: Secondary | ICD-10-CM

## 2021-07-31 NOTE — Telephone Encounter (Signed)
Please advise it's time to recheck CBC since WBC and platelets were elevated on labs in July. Please leave order at lab. Does not need to be fasting.

## 2021-07-31 NOTE — Telephone Encounter (Signed)
Patient advised and agrees to have lab work done.  Patient also wanted to have a message sent to Dr. Sherrie Mustache. She says that during her last office visit on 06/21/2021 her Lisinopril was changed to Valsartan because her ENT requested it to be changed. Patient states at first she was doing fine on the Valsartan, then she started having swelling in her legs starting in her knees down to her feet. Patient says that she stopped taking Valsartan and went back to taking Lisinopril/ HCTZ 10-12.5MG  daily. Since switching back to the Lisinopril/ HCTZ, her swelling has resolved. She wants to stay on Lisinopril/ HCTZ. She says she is tolerating it well with no adverse effects.

## 2021-08-01 ENCOUNTER — Other Ambulatory Visit: Payer: Self-pay | Admitting: Family Medicine

## 2021-08-01 DIAGNOSIS — Z1231 Encounter for screening mammogram for malignant neoplasm of breast: Secondary | ICD-10-CM

## 2021-08-03 LAB — CBC WITH DIFFERENTIAL/PLATELET
Basophils Absolute: 0.1 10*3/uL (ref 0.0–0.2)
Basos: 1 %
EOS (ABSOLUTE): 0.1 10*3/uL (ref 0.0–0.4)
Eos: 2 %
Hematocrit: 38.1 % (ref 34.0–46.6)
Hemoglobin: 12.1 g/dL (ref 11.1–15.9)
Immature Grans (Abs): 0 10*3/uL (ref 0.0–0.1)
Immature Granulocytes: 0 %
Lymphocytes Absolute: 2.4 10*3/uL (ref 0.7–3.1)
Lymphs: 37 %
MCH: 27.9 pg (ref 26.6–33.0)
MCHC: 31.8 g/dL (ref 31.5–35.7)
MCV: 88 fL (ref 79–97)
Monocytes Absolute: 0.6 10*3/uL (ref 0.1–0.9)
Monocytes: 8 %
Neutrophils Absolute: 3.4 10*3/uL (ref 1.4–7.0)
Neutrophils: 52 %
Platelets: 309 10*3/uL (ref 150–450)
RBC: 4.33 x10E6/uL (ref 3.77–5.28)
RDW: 14 % (ref 11.7–15.4)
WBC: 6.5 10*3/uL (ref 3.4–10.8)

## 2021-08-23 ENCOUNTER — Other Ambulatory Visit: Payer: Self-pay

## 2021-08-23 ENCOUNTER — Ambulatory Visit
Admission: RE | Admit: 2021-08-23 | Discharge: 2021-08-23 | Disposition: A | Discharge status: Home / Self Care | Payer: No Typology Code available for payment source | Source: Ambulatory Visit | Attending: Family Medicine | Admitting: Family Medicine

## 2021-08-23 DIAGNOSIS — Z1231 Encounter for screening mammogram for malignant neoplasm of breast: Secondary | ICD-10-CM | POA: Diagnosis present

## 2021-10-08 ENCOUNTER — Telehealth: Payer: Self-pay | Admitting: Family Medicine

## 2021-10-08 MED ORDER — CETIRIZINE HCL 10 MG PO TABS: 10.0000 mg | ORAL_TABLET | Freq: Every day | ORAL | 1 refills | Status: DC

## 2021-10-08 NOTE — Telephone Encounter (Signed)
[facility] faxed refill request for the following medications:  cetirizine (ZYRTEC) 10 MG tablet  Last Rx: 10/15/20 LOV: 06/21/21 NOV: 06/23/22 Please advise. Thanks TNP

## 2021-11-08 ENCOUNTER — Other Ambulatory Visit: Payer: Self-pay | Admitting: Family Medicine

## 2022-02-06 ENCOUNTER — Other Ambulatory Visit: Payer: Self-pay | Admitting: Family Medicine

## 2022-05-12 ENCOUNTER — Other Ambulatory Visit: Payer: Self-pay | Admitting: Family Medicine

## 2022-05-29 ENCOUNTER — Ambulatory Visit: Payer: Self-pay

## 2022-05-29 ENCOUNTER — Ambulatory Visit (INDEPENDENT_AMBULATORY_CARE_PROVIDER_SITE_OTHER): Payer: BC Managed Care – PPO | Admitting: Physician Assistant

## 2022-05-29 ENCOUNTER — Encounter: Payer: Self-pay | Admitting: Physician Assistant

## 2022-05-29 VITALS — BP 166/90 | HR 69 | Temp 98.2°F | Resp 16 | Wt 150.4 lb

## 2022-05-29 DIAGNOSIS — L255 Unspecified contact dermatitis due to plants, except food: Secondary | ICD-10-CM | POA: Diagnosis not present

## 2022-05-29 MED ORDER — PREDNISONE 20 MG PO TABS
ORAL_TABLET | ORAL | 0 refills | Status: DC
Start: 2022-05-29 — End: 2022-07-03

## 2022-05-29 MED ORDER — HYDROXYZINE PAMOATE 25 MG PO CAPS: ORAL_CAPSULE | ORAL | 0 refills | Status: DC

## 2022-05-29 NOTE — Telephone Encounter (Signed)
  Chief Complaint: Poison Ivy rash Symptoms: Widespread rash with bubbles Frequency: over the weekend Pertinent Negatives: Patient denies  Disposition: [] ED /[] Urgent Care (no appt availability in office) / [] Appointment(In office/virtual)/ []  [place] [facility]/ [] Home Care/ [x] Refused Recommended Disposition /[] [place] [facility]/ []  Follow-up with PCP Additional Notes: PT has had poison Ivy in the past and been seen by Dr. for it. Pt would like medications called in to the pharmacy for rash. Dr. had given the pt prednisone and hydroxyzine previously for [facility]. Rash started at there ankles  - it has now spread under her breasts, her arms and is generally widespread.  Reason for Disposition  MODERATE to SEVERE itching (e.g., interferes with work, school, sleep, or other activities)  Answer Assessment - Initial Assessment Questions 1. APPEARANCE of RASH: "Describe the rash."      Bubbles 2. LOCATION: "Where is the rash located?"  (e.g., face, genitals, hands, legs)     Legs  under breasts 3. SIZE: "How large is the rash?"      Started at ankles now everywhere. 4. ONSET: "When did the rash begin?"      Over the weekend 5. ITCHING: "Does the rash itch?" If Yes, ask: "How bad is it?"   - MILD - doesn't interfere with normal activities   - MODERATE-SEVERE: interferes with work, school, sleep, or other activities      Severe 6. EXPOSURE:  "How were you exposed to the plant (poison ivy, poison oak, sumac)"  "When were you exposed?"       Clean up the yard 7. PAST HISTORY: "Have you had a poison ivy rash before?" If Yes, ask: "How bad was it?"     Yes 8. PREGNANCY: "Is there any chance you are pregnant?" "When was your last menstrual period?"     na  Protocols used: Poison Ivy - Oak - [facility]

## 2022-05-29 NOTE — Telephone Encounter (Signed)
FYI-patient is scheduled to see you this afternoon

## 2022-05-29 NOTE — Progress Notes (Signed)
I,Valerie Henry,acting as a Neurosurgeon for [employer], PA-C.,have documented all relevant documentation on the behalf of Valerie Lat, PA-C,as directed by  [employer], PA-C while in the presence of [employer], PA-C.  Established patient visit   Patient: Valerie Henry   DOB: 1956-11-11   66 y.o. Female  MRN: [id number] Visit Date: 05/29/2022  Today's healthcare provider: Debera Lat, PA-C   Chief Complaint  Patient presents with   Rash  CC: rash  Subjective     Valerie Henry is a 66 yr old female presenting today for rash, blisters and severe itching.  Onset last weekend. Reports working in the yard.  Rash started at the ankles and has spread under breasts, arms and generally widespread. Reports having poison ivy before and sure that's what it is.  Applying Zenfel for itching with temporary relief.    Medications: Outpatient Medications Prior to Visit  Medication Sig   Ascorbic Acid (VITAMIN C PO) Take by mouth daily.   aspirin 81 MG tablet Take 1 tablet by mouth daily. Reported on 06/06/2016   CALCIUM PO Take by mouth daily.   cetirizine (ZYRTEC) 10 MG tablet Take 1 tablet by mouth once daily   Cholecalciferol (VITAMIN D PO) Take by mouth daily.   fluticasone (FLONASE) 50 MCG/ACT nasal spray USE 2 SPRAY(S) IN EACH NOSTRIL ONCE DAILY   hydrOXYzine (ATARAX/VISTARIL) 25 MG tablet Take 1-2 tablets (25-50 mg total) by mouth at bedtime as needed for itching.   levofloxacin (LEVAQUIN) 750 MG tablet Take 750 mg by mouth daily.   lisinopril-hydrochlorothiazide (ZESTORETIC) 10-12.5 MG tablet Take 1 tablet by mouth once daily   metoprolol succinate (TOPROL-XL) 50 MG 24 hr tablet Take 1 tablet by mouth once daily   Multiple Vitamin (MULTIVITAMIN) capsule Take 1 capsule by mouth daily.   POTASSIUM PO Take by mouth daily.   predniSONE (STERAPRED UNI-PAK 48 TAB) 10 MG (48) TBPK tablet Take by mouth as directed. 10 day tapered dose   simvastatin (ZOCOR) 40 MG tablet TAKE 1  TABLET BY MOUTH ONCE DAILY IN THE EVENING   No facility-administered medications prior to visit.    Review of Systems  Constitutional:  Positive for activity change and appetite change. Negative for fever.  Skin:  Positive for color change and rash.       Objective    BP (!) 166/90 (BP Location: Right Arm, Patient Position: Sitting, Cuff Size: Normal)   Pulse 69   Temp 98.2 F (36.8 C) (Oral)   Resp 16   Wt 150 lb 6.4 oz (68.2 kg)   SpO2 99%   BMI 26.64 kg/m    Physical Exam Vitals reviewed.  Constitutional:      General: She is not in acute distress.    Appearance: Normal appearance. She is well-developed. She is not diaphoretic.  HENT:     Head: Normocephalic and atraumatic.  Eyes:     General: No scleral icterus.    Conjunctiva/sclera: Conjunctivae normal.  Neck:     Thyroid: No thyromegaly.  Cardiovascular:     Rate and Rhythm: Normal rate and regular rhythm.     Pulses: Normal pulses.     Heart sounds: Normal heart sounds. No murmur heard. Pulmonary:     Effort: Pulmonary effort is normal. No respiratory distress.     Breath sounds: Normal breath sounds. No wheezing, rhonchi or rales.  Musculoskeletal:     Cervical back: Normal range of motion and neck supple.  Right lower leg: No edema.     Left lower leg: No edema.  Lymphadenopathy:     Cervical: No cervical adenopathy.  Skin:    General: Skin is warm and dry.     Findings: Rash (itchy) present.     Comments: Noted: Papules, vesicles, bullae with surrounding erythema. Oozing Pruritus/ R forearm. Left ankle. Inframammary folds.   Neurological:     General: No focal deficit present.     Mental Status: She is alert and oriented to person, place, and time. Mental status is at baseline.  Psychiatric:        Mood and Affect: Mood normal.        Behavior: Behavior normal.        Thought Content: Thought content normal.        Judgment: Judgment normal.     No results found for any visits on  05/29/22.  Assessment & Plan     1. Rhus dermatitis Advised to continue: Topical soaks with cool tap water, [place]row solution (1:40 dilution), saline (1 tsp/pt water), or silver nitrate solution Aveeno oatmeal baths Emollients (white petrolatum, Eucerin) Patient prefers not to proceed with steroid injections due to her fear of needles. - hydrOXYzine (VISTARIL) 25 MG capsule; Please, take at bedtime  Dispense: 30 capsule; Refill: 0 - predniSONE (DELTASONE) 20 MG tablet; Take 1 tablet three times daily for 3 days, then twice daily for 3 days then once daily until gone.  Dispense: 20 tablet; Refill: 0   Fu if needed The patient was advised to call back or seek an in-person evaluation if the symptoms worsen or if the condition fails to improve as anticipated.  I discussed the assessment and treatment plan with the patient. The patient was provided an opportunity to ask questions and all were answered. The patient agreed with the plan and demonstrated an understanding of the instructions.  The entirety of the information documented in the History of Present Illness, Review of Systems and Physical Exam were personally obtained by me. Portions of this information were initially documented by the CMA and reviewed by me for thoroughness and accuracy.  Portions of this note were created using dictation software and may contain typographical errors.       Valerie Lat, PA-C  [facility] [phone] (phone) [phone] (fax)  [facility]

## 2022-06-23 ENCOUNTER — Encounter: Payer: Self-pay | Admitting: Family Medicine

## 2022-07-03 ENCOUNTER — Ambulatory Visit (INDEPENDENT_AMBULATORY_CARE_PROVIDER_SITE_OTHER): Payer: BC Managed Care – PPO | Admitting: Family Medicine

## 2022-07-03 ENCOUNTER — Encounter: Payer: Self-pay | Admitting: Family Medicine

## 2022-07-03 VITALS — BP 136/88 | HR 70 | Temp 98.0°F | Resp 16 | Ht 64.0 in | Wt 150.7 lb

## 2022-07-03 DIAGNOSIS — E7800 Pure hypercholesterolemia, unspecified: Secondary | ICD-10-CM | POA: Diagnosis not present

## 2022-07-03 DIAGNOSIS — I10 Essential (primary) hypertension: Secondary | ICD-10-CM

## 2022-07-03 DIAGNOSIS — J301 Allergic rhinitis due to pollen: Secondary | ICD-10-CM

## 2022-07-03 DIAGNOSIS — R739 Hyperglycemia, unspecified: Secondary | ICD-10-CM | POA: Diagnosis not present

## 2022-07-03 DIAGNOSIS — Z0000 Encounter for general adult medical examination without abnormal findings: Secondary | ICD-10-CM | POA: Diagnosis not present

## 2022-07-03 DIAGNOSIS — Z13820 Encounter for screening for osteoporosis: Secondary | ICD-10-CM

## 2022-07-03 DIAGNOSIS — M810 Age-related osteoporosis without current pathological fracture: Secondary | ICD-10-CM | POA: Insufficient documentation

## 2022-07-03 DIAGNOSIS — R7303 Prediabetes: Secondary | ICD-10-CM | POA: Insufficient documentation

## 2022-07-03 DIAGNOSIS — Z1231 Encounter for screening mammogram for malignant neoplasm of breast: Secondary | ICD-10-CM | POA: Insufficient documentation

## 2022-07-03 MED ORDER — MONTELUKAST SODIUM 10 MG PO TABS: 10.0000 mg | ORAL_TABLET | Freq: Every day | ORAL | 3 refills | Status: AC

## 2022-07-03 NOTE — Patient Instructions (Signed)
Please call and schedule your mammogram:  [facility]  [address], [address] [facility] [place],  [place]  [address] Get Driving Directions Main: [phone]  Sunday:Closed Monday:7:20 AM - 5:00 PM Tuesday:7:20 AM - 5:00 PM Wednesday:7:20 AM - 5:00 PM Thursday:7:20 AM - 5:00 PM Friday:7:20 AM - 4:30 PM Saturday:Closed  

## 2022-07-03 NOTE — Assessment & Plan Note (Signed)
Chronic, elevated On zocor 40  recommend diet low in saturated fat and regular exercise - 30 min at least 5 times per week Repeat FLP

## 2022-07-03 NOTE — Assessment & Plan Note (Signed)
Due for screening for mammogram, denies breast concerns, provided with phone number to call and schedule appointment for mammogram. Encouraged to repeat breast cancer screening every 1-2 years.  

## 2022-07-03 NOTE — Assessment & Plan Note (Signed)
Chronic, stable 136/88 Goal <140/<90 Denies CP Denies SOB/ DOE Denies low blood pressure/hypotension Denies vision changes No LE Edema noted on exam Continue medication, Zestoretic 10-12.5, Toprol 50 Denies side effects Seek emergent care if you develop chest pain or chest pressure

## 2022-07-03 NOTE — Assessment & Plan Note (Signed)
UTD on vision ?UTD on dental ?Things to do to keep yourself healthy  ?- Exercise at least 30-45 minutes a day, 3-4 days a week.  ?- Eat a low-fat diet with lots of fruits and vegetables, up to 7-9 servings per day.  ?- Seatbelts can save your life. Wear them always.  ?- Smoke detectors on every level of your home, check batteries every year.  ?- Eye Doctor - have an eye exam every 1-2 years  ?- Safe sex - if you may be exposed to STDs, use a condom.  ?- Alcohol -  If you drink, do it moderately, less than 2 drinks per day.  ?- Health Care Power of Attorney. Choose someone to speak for you if you are not able.  ?- Depression is common in our stressful world.If you're feeling down or losing interest in things you normally enjoy, please come in for a visit.  ?- Violence - If anyone is threatening or hurting you, please call immediately. ? ? ?

## 2022-07-03 NOTE — Progress Notes (Signed)
Complete physical exam  I,Joseline E Rosas,acting as a scribe for Gwyneth Sprout, FNP.,have documented all relevant documentation on the behalf of Gwyneth Sprout, FNP,as directed by  Gwyneth Sprout, FNP while in the presence of Gwyneth Sprout, FNP.   Patient: Valerie Henry   DOB: Jul 01, 1956   66 y.o. Female  MRN: [id number] Visit Date: 07/03/2022  Today's healthcare provider: Gwyneth Sprout, FNP  Introduced to nurse practitioner role and practice setting.  All questions answered.  Discussed provider/patient relationship and expectations.   Chief Complaint  Patient presents with   Annual Exam   Subjective    Valerie Henry is a 66 y.o. female who presents today for a complete physical exam.  She reports consuming a  general, no dairy  diet. Home exercise routine includes yard and cardio. She generally feels well. She reports sleeping well. She does not have additional problems to discuss today.   HPI  Patient declined PCV-20 and Shingles Vaccine.  Past Medical History:  Diagnosis Date   Arthritis    thumbs   Chicken pox    Measles    Mumps    Stroke ([place]) 08/17/14   No deficits   Past Surgical History:  Procedure Laterality Date   COLONOSCOPY WITH PROPOFOL N/A 07/03/2016   Procedure: COLONOSCOPY WITH PROPOFOL;  Surgeon: Lucilla Lame, MD;  Location: [place];  Service: Endoscopy;  Laterality: N/A;   TUBAL LIGATION  80's   Social History   Socioeconomic History   Marital status: Married    Spouse name: Not on file   Number of children: 2   Years of education: HS Grad   Highest education level: Not on file  Occupational History   Not on file  Tobacco Use   Smoking status: Never   Smokeless tobacco: Never  Vaping Use   Vaping Use: Never used  Substance and Sexual Activity   Alcohol use: Yes    Alcohol/week: 4.0 standard drinks of alcohol    Types: 4 Standard drinks or equivalent per week    Comment: 1 glass of wine on weekends   Drug use: No    Sexual activity: Not on file  Other Topics Concern   Not on file  Social History Narrative   Not on file   Social Determinants of Health   Financial Resource Strain: Not on file  Food Insecurity: Not on file  Transportation Needs: Not on file  Physical Activity: Not on file  Stress: Not on file  Social Connections: Not on file  Intimate Partner Violence: Not on file   Family Status  Relation Name Status   Mother  Deceased       Ovarian cancer   Father  Deceased at age 46       MI   Brother  Alive       In good health   Daughter  Alive       In good health   Son  Alive       In good health, adopted   Psychiatrist  Alive   Neg Hx  (Not Specified)   Family History  Problem Relation Age of Onset   Ovarian cancer Mother        late 57's   Heart attack Father    Dementia Father    Heart attack Paternal Uncle    Breast cancer Neg Hx    Allergies  Allergen Reactions   Lactose Intolerance (Gi)  GI upset    Patient Care Team: Gwyneth Sprout, FNP as PCP - General (Family Medicine)   Medications: Outpatient Medications Prior to Visit  Medication Sig   Ascorbic Acid (VITAMIN C PO) Take by mouth daily.   aspirin 81 MG tablet Take 1 tablet by mouth daily. Reported on 06/06/2016   CALCIUM PO Take by mouth daily.   cetirizine (ZYRTEC) 10 MG tablet Take 1 tablet by mouth once daily   Cholecalciferol (VITAMIN D PO) Take by mouth daily.   fluticasone (FLONASE) 50 MCG/ACT nasal spray USE 2 SPRAY(S) IN EACH NOSTRIL ONCE DAILY   lisinopril-hydrochlorothiazide (ZESTORETIC) 10-12.5 MG tablet Take 1 tablet by mouth once daily   metoprolol succinate (TOPROL-XL) 50 MG 24 hr tablet Take 1 tablet by mouth once daily   Multiple Vitamin (MULTIVITAMIN) capsule Take 1 capsule by mouth daily.   POTASSIUM PO Take by mouth daily.   simvastatin (ZOCOR) 40 MG tablet TAKE 1 TABLET BY MOUTH ONCE DAILY IN THE EVENING   [DISCONTINUED] hydrOXYzine (VISTARIL) 25 MG capsule Please, take at bedtime    [DISCONTINUED] levofloxacin (LEVAQUIN) 750 MG tablet Take 750 mg by mouth daily.   [DISCONTINUED] predniSONE (DELTASONE) 20 MG tablet Take 1 tablet three times daily for 3 days, then twice daily for 3 days then once daily until gone.   No facility-administered medications prior to visit.    Review of Systems  Constitutional: Negative.   HENT: Negative.    Eyes: Negative.   Respiratory: Negative.    Cardiovascular: Negative.   Gastrointestinal: Negative.   Endocrine: Negative.   Genitourinary: Negative.   Musculoskeletal: Negative.   Skin: Negative.   Allergic/Immunologic: Negative.   Neurological: Negative.   Hematological: Negative.   Psychiatric/Behavioral: Negative.      Last CBC Lab Results  Component Value Date   WBC 6.5 08/02/2021   HGB 12.1 08/02/2021   HCT 38.1 08/02/2021   MCV 88 08/02/2021   MCH 27.9 08/02/2021   RDW 14.0 08/02/2021   PLT 309 96/75/9163   Last metabolic panel Lab Results  Component Value Date   GLUCOSE 101 (H) 06/25/2021   NA 140 06/25/2021   K 4.5 06/25/2021   CL 101 06/25/2021   CO2 21 06/25/2021   BUN 27 06/25/2021   CREATININE 0.97 06/25/2021   EGFR 65 06/25/2021   CALCIUM 9.9 06/25/2021   PHOS 4.0 05/28/2015   PROT 6.9 06/25/2021   ALBUMIN 4.7 06/25/2021   LABGLOB 2.2 06/25/2021   AGRATIO 2.1 06/25/2021   BILITOT 0.4 06/25/2021   ALKPHOS 99 06/25/2021   AST 18 06/25/2021   ALT 28 06/25/2021   ANIONGAP 7 08/18/2014   Last lipids Lab Results  Component Value Date   CHOL 216 (H) 06/25/2021   HDL 82 06/25/2021   LDLCALC 113 (H) 06/25/2021   TRIG 122 06/25/2021   CHOLHDL 2.6 06/25/2021   Last hemoglobin A1c Lab Results  Component Value Date   HGBA1C 5.7 08/18/2014   Last thyroid functions Lab Results  Component Value Date   TSH 1.400 06/17/2019      Objective     BP 136/88 (BP Location: Right Arm, Patient Position: Sitting, Cuff Size: Normal)   Pulse 70   Temp 98 F (36.7 C) (Oral)   Resp 16   Ht _0   (1.626 m)   Wt 150 lb 11.2 oz (68.4 kg)   BMI 25.87 kg/m   BP Readings from Last 3 Encounters:  07/03/22 136/88  05/29/22 (!) 166/90  06/21/21 (!) 159/96  Wt Readings from Last 3 Encounters:  07/03/22 150 lb 11.2 oz (68.4 kg)  05/29/22 150 lb 6.4 oz (68.2 kg)  06/21/21 155 lb (70.3 kg)   SpO2 Readings from Last 3 Encounters:  05/29/22 99%  06/19/20 98%  06/11/18 97%     Physical Exam Vitals and nursing note reviewed.  Constitutional:      General: She is awake. She is not in acute distress.    Appearance: Normal appearance. She is well-developed, well-groomed and normal weight. She is not ill-appearing, toxic-appearing or diaphoretic.  HENT:     Head: Normocephalic and atraumatic.     Jaw: There is normal jaw occlusion. No trismus, tenderness, swelling or pain on movement.     Right Ear: Hearing, tympanic membrane, ear canal and external ear normal. There is no impacted cerumen.     Left Ear: Hearing, tympanic membrane, ear canal and external ear normal. There is no impacted cerumen.     Nose: Nose normal. No congestion or rhinorrhea.     Right Turbinates: Not enlarged, swollen or pale.     Left Turbinates: Not enlarged, swollen or pale.     Right Sinus: No maxillary sinus tenderness or frontal sinus tenderness.     Left Sinus: No maxillary sinus tenderness or frontal sinus tenderness.     Mouth/Throat:     Lips: Pink.     Mouth: Mucous membranes are moist. No injury.     Tongue: No lesions.     Pharynx: Oropharynx is clear. Uvula midline. No pharyngeal swelling, oropharyngeal exudate, posterior oropharyngeal erythema or uvula swelling.     Tonsils: No tonsillar exudate or tonsillar abscesses.  Eyes:     General: Lids are normal. Lids are everted, no foreign bodies appreciated. Vision grossly intact. Gaze aligned appropriately. No allergic shiner or visual field deficit.       Right eye: No discharge.        Left eye: No discharge.     Extraocular Movements: Extraocular  movements intact.     Conjunctiva/sclera: Conjunctivae normal.     Right eye: Right conjunctiva is not injected. No exudate.    Left eye: Left conjunctiva is not injected. No exudate.    Pupils: Pupils are equal, round, and reactive to light.  Neck:     Thyroid: No thyroid mass, thyromegaly or thyroid tenderness.     Vascular: No carotid bruit.     Trachea: Trachea normal.  Cardiovascular:     Rate and Rhythm: Normal rate and regular rhythm.     Pulses: Normal pulses.          Carotid pulses are 2+ on the right side and 2+ on the left side.      Radial pulses are 2+ on the right side and 2+ on the left side.       Dorsalis pedis pulses are 2+ on the right side and 2+ on the left side.       Posterior tibial pulses are 2+ on the right side and 2+ on the left side.     Heart sounds: Normal heart sounds, S1 normal and S2 normal. No murmur heard.    No friction rub. No gallop.  Pulmonary:     Effort: Pulmonary effort is normal. No respiratory distress.     Breath sounds: Normal breath sounds and air entry. No stridor. No wheezing, rhonchi or rales.  Chest:     Chest wall: No tenderness.     Comments: Breasts: not examined, patient declines  to have breast exam  Abdominal:     General: Abdomen is flat. Bowel sounds are normal. There is no distension.     Palpations: Abdomen is soft. There is no mass.     Tenderness: There is no abdominal tenderness. There is no right CVA tenderness, left CVA tenderness, guarding or rebound.     Hernia: No hernia is present.  Genitourinary:    Comments: Exam deferred; denies complaints Musculoskeletal:        General: No swelling, tenderness, deformity or signs of injury. Normal range of motion.     Cervical back: Full passive range of motion without pain, normal range of motion and neck supple. No edema, rigidity or tenderness. No muscular tenderness.     Right lower leg: No edema.     Left lower leg: No edema.  Lymphadenopathy:     Cervical: No  cervical adenopathy.     Right cervical: No superficial, deep or posterior cervical adenopathy.    Left cervical: No superficial, deep or posterior cervical adenopathy.  Skin:    General: Skin is warm and dry.     Capillary Refill: Capillary refill takes less than 2 seconds.     Coloration: Skin is not jaundiced or pale.     Findings: No bruising, erythema, lesion or rash.  Neurological:     General: No focal deficit present.     Mental Status: She is alert and oriented to person, place, and time. Mental status is at baseline.     GCS: GCS eye subscore is 4. GCS verbal subscore is 5. GCS motor subscore is 6.     Sensory: Sensation is intact. No sensory deficit.     Motor: Motor function is intact. No weakness.     Coordination: Coordination is intact. Coordination normal.     Gait: Gait is intact. Gait normal.  Psychiatric:        Attention and Perception: Attention and perception normal.        Mood and Affect: Mood and affect normal.        Speech: Speech normal.        Behavior: Behavior normal. Behavior is cooperative.        Thought Content: Thought content normal.        Cognition and Memory: Cognition and memory normal.        Judgment: Judgment normal.    Last depression screening scores    07/03/2022    8:41 AM 05/29/2022    1:14 PM 06/21/2021    9:19 AM  PHQ 2/9 Scores  PHQ - 2 Score 0 0 0  PHQ- 9 Score 0 0    Last fall risk screening    07/03/2022    8:41 AM  [place] in the past year? 0  Number falls in past yr: 0  Injury with Fall? 0   Last Audit-C alcohol use screening    07/03/2022    8:41 AM  Alcohol Use Disorder Test (AUDIT)  1. How often do you have a drink containing alcohol? 2  2. How many drinks containing alcohol do you have on a typical day when you are drinking? 0  3. How often do you have six or more drinks on one occasion? 0  AUDIT-C Score 2   A score of 3 or more in women, and 4 or more in men indicates increased risk for alcohol  abuse, EXCEPT if all of the points are from question 1   No  results found for any visits on 07/03/22.  Assessment & Plan    Routine Health Maintenance and Physical Exam  Exercise Activities and Dietary recommendations  Goals   None     Immunization History  Administered Date(s) Administered   Tdap 05/28/2015   Zoster, Live 05/24/2016    Health Maintenance  Topic Date Due   COVID-19 Vaccine (1) Never done   Zoster Vaccines- Shingrix (1 of 2) Never done   Pneumonia Vaccine 44+ Years old (1 - PCV) Never done   DEXA SCAN  Never done   INFLUENZA VACCINE  07/08/2022   MAMMOGRAM  08/24/2023   TETANUS/TDAP  05/27/2025   COLONOSCOPY (Pts 45-11yr Insurance coverage will need to be confirmed)  07/03/2026   Hepatitis C Screening  Completed   HPV VACCINES  Aged Out    Discussed health benefits of physical activity, and encouraged her to engage in regular exercise appropriate for her age and condition.  Problem List Items Addressed This Visit       Cardiovascular and Mediastinum   Essential (primary) hypertension    Chronic, stable 136/88 Goal <140/<90 Denies CP Denies SOB/ DOE Denies low blood pressure/hypotension Denies vision changes No LE Edema noted on exam Continue medication, Zestoretic 10-12.5, Toprol 50 Denies side effects Seek emergent care if you develop chest pain or chest pressure       Relevant Orders   Comprehensive metabolic panel   Lipid panel     Respiratory   Non-seasonal allergic rhinitis due to pollen    Chronic, bothersome Reports recent exacerbation Use of daily zyrtec Use of daily flonase-2 sprays Add Singulair as trial  Refer to ENT if necessary or for allergy testing      Relevant Medications   montelukast (SINGULAIR) 10 MG tablet     Other   Annual physical exam - Primary    UTD on vision UTD on dental Things to do to keep yourself healthy  - Exercise at least 30-45 minutes a day, 3-4 days a week.  - Eat a low-fat diet with  lots of fruits and vegetables, up to 7-9 servings per day.  - Seatbelts can save your life. Wear them always.  - Smoke detectors on every level of your home, check batteries every year.  - Eye Doctor - have an eye exam every 1-2 years  - Safe sex - if you may be exposed to STDs, use a condom.  - Alcohol -  If you drink, do it moderately, less than 2 drinks per day.  - [place] Choose someone to speak for you if you are not able.  - Depression is common in our stressful world.If you're feeling down or losing interest in things you normally enjoy, please come in for a visit.  - Violence - If anyone is threatening or hurting you, please call immediately.        Relevant Orders   CBC with Differential/Platelet   Comprehensive metabolic panel   Lipid panel   Hemoglobin A1c   TSH + free T4   Elevated serum glucose    Check A1c Continue to recommend balanced, lower carb meals. Smaller meal size, adding snacks. Choosing water as drink of choice and increasing purposeful exercise.       Relevant Orders   Hemoglobin A1c   Encounter for screening for osteoporosis    Due for DEXA; screening for OP      Relevant Orders   DG Bone Density   Encounter for screening  mammogram for breast cancer    Due for screening for mammogram, denies breast concerns, provided with phone number to call and schedule appointment for mammogram. Encouraged to repeat breast cancer screening every 1-2 years.       Relevant Orders   MM 3D SCREEN BREAST BILATERAL   HLD (hyperlipidemia)    Chronic, elevated On zocor 40  recommend diet low in saturated fat and regular exercise - 30 min at least 5 times per week Repeat FLP       Relevant Orders   Lipid panel     Return in about 6 months (around 01/03/2023) for chonic disease management.     Vonna Kotyk, FNP, have reviewed all documentation for this visit. The documentation on 07/03/22 for the exam, diagnosis, procedures, and  orders are all accurate and complete.    Gwyneth Sprout, [place] [phone] (phone) [phone] (fax)  [place]

## 2022-07-03 NOTE — Assessment & Plan Note (Signed)
Due for DEXA; screening for OP

## 2022-07-03 NOTE — Assessment & Plan Note (Signed)
Check A1c Continue to recommend balanced, lower carb meals. Smaller meal size, adding snacks. Choosing water as drink of choice and increasing purposeful exercise.  

## 2022-07-03 NOTE — Assessment & Plan Note (Signed)
Chronic, bothersome Reports recent exacerbation Use of daily zyrtec Use of daily flonase-2 sprays Add Singulair as trial  Refer to ENT if necessary or for allergy testing

## 2022-07-04 LAB — CBC WITH DIFFERENTIAL/PLATELET
Basophils Absolute: 0.1 10*3/uL (ref 0.0–0.2)
Basos: 1 %
EOS (ABSOLUTE): 0.1 10*3/uL (ref 0.0–0.4)
Eos: 2 %
Hematocrit: 39.1 % (ref 34.0–46.6)
Hemoglobin: 12.9 g/dL (ref 11.1–15.9)
Immature Grans (Abs): 0 10*3/uL (ref 0.0–0.1)
Immature Granulocytes: 0 %
Lymphocytes Absolute: 1.7 10*3/uL (ref 0.7–3.1)
Lymphs: 33 %
MCH: 28 pg (ref 26.6–33.0)
MCHC: 33 g/dL (ref 31.5–35.7)
MCV: 85 fL (ref 79–97)
Monocytes Absolute: 0.5 10*3/uL (ref 0.1–0.9)
Monocytes: 9 %
Neutrophils Absolute: 2.8 10*3/uL (ref 1.4–7.0)
Neutrophils: 55 %
Platelets: 324 10*3/uL (ref 150–450)
RBC: 4.61 x10E6/uL (ref 3.77–5.28)
RDW: 13.4 % (ref 11.7–15.4)
WBC: 5.2 10*3/uL (ref 3.4–10.8)

## 2022-07-04 LAB — COMPREHENSIVE METABOLIC PANEL
ALT: 15 IU/L (ref 0–32)
AST: 19 IU/L (ref 0–40)
Albumin/Globulin Ratio: 2.3 — ABNORMAL HIGH (ref 1.2–2.2)
Albumin: 4.5 g/dL (ref 3.9–4.9)
Alkaline Phosphatase: 95 IU/L (ref 44–121)
BUN/Creatinine Ratio: 17 (ref 12–28)
BUN: 16 mg/dL (ref 8–27)
Bilirubin Total: 0.3 mg/dL (ref 0.0–1.2)
CO2: 21 mmol/L (ref 20–29)
Calcium: 9.7 mg/dL (ref 8.7–10.3)
Chloride: 105 mmol/L (ref 96–106)
Creatinine, Ser: 0.94 mg/dL (ref 0.57–1.00)
Globulin, Total: 2 g/dL (ref 1.5–4.5)
Glucose: 94 mg/dL (ref 70–99)
Potassium: 4.2 mmol/L (ref 3.5–5.2)
Sodium: 141 mmol/L (ref 134–144)
Total Protein: 6.5 g/dL (ref 6.0–8.5)
eGFR: 67 mL/min/{1.73_m2} (ref >59)

## 2022-07-04 LAB — TSH+FREE T4
Free T4: 1.36 ng/dL (ref 0.82–1.77)
TSH: 1.11 u[IU]/mL (ref 0.450–4.500)

## 2022-07-04 LAB — LIPID PANEL
Chol/HDL Ratio: 2.6 ratio (ref 0.0–4.4)
Cholesterol, Total: 191 mg/dL (ref 100–199)
HDL: 74 mg/dL (ref >39)
LDL Chol Calc (NIH): 103 mg/dL — ABNORMAL HIGH (ref 0–99)
Triglycerides: 74 mg/dL (ref 0–149)
VLDL Cholesterol Cal: 14 mg/dL (ref 5–40)

## 2022-07-04 LAB — HEMOGLOBIN A1C
Est. average glucose Bld gHb Est-mCnc: 126 mg/dL
Hgb A1c MFr Bld: 6 % — ABNORMAL HIGH (ref 4.8–5.6)

## 2022-07-04 NOTE — Progress Notes (Signed)
Cholesterol improved; LDL remains above goal of <032. I recommend diet low in saturated fat and regular exercise - 30 min at least 5 times per week  Risk of heart attack/stroke is elevated at 8% in 10 years. The 10-year ASCVD risk score (Arnett DK, et al., 2019) is: 8.2% Recommend increase in strength of your cholesterol medication, Zocor 40 mg, to Crestor 10 mg to assist. Please let us know if you would like to start on this medication.   Values used to calculate the score:     Age: 66 years     Sex: Female     Is Non-Hispanic African American: No     Diabetic: No     Tobacco smoker: No     Systolic Blood Pressure: 136 mmHg     Is BP treated: Yes     HDL Cholesterol: 74 mg/dL     Total Cholesterol: 191 mg/dL  Z2Y continues to show pre-diabetes. Continue to recommend balanced, lower carb meals. Smaller meal size, adding snacks. Choosing water as drink of choice and increasing purposeful exercise. Can start Metformin XR 500-760 mg QD to assist. Please let us know if you would like to start on this medication.  All other labs normal and stable.  Please call and schedule your mammogram and DEXA.  Please let us know if you have any questions.  Thank you, Jacky Kindle, FNP  [facility] [address] [place], [place] [address] [phone] (phone) [phone] (fax) [facility], [facility]

## 2022-07-08 ENCOUNTER — Other Ambulatory Visit: Payer: Self-pay | Admitting: *Deleted

## 2022-07-08 DIAGNOSIS — R7303 Prediabetes: Secondary | ICD-10-CM

## 2022-07-08 DIAGNOSIS — E7800 Pure hypercholesterolemia, unspecified: Secondary | ICD-10-CM

## 2022-07-08 MED ORDER — METFORMIN HCL ER 750 MG PO TB24: 750.0000 mg | ORAL_TABLET | Freq: Every day | ORAL | 1 refills | Status: DC

## 2022-07-08 MED ORDER — ROSUVASTATIN CALCIUM 10 MG PO TABS: 10.0000 mg | ORAL_TABLET | Freq: Every day | ORAL | 5 refills | Status: DC

## 2022-07-19 IMAGING — MG MM DIGITAL SCREENING BILAT W/ TOMO AND CAD
8 series · 8 of 24 positions shown · non-contrast
Comparison: Previous exam(s).

CLINICAL DATA: Screening.

EXAM:
DIGITAL SCREENING BILATERAL MAMMOGRAM WITH TOMOSYNTHESIS AND CAD
TECHNIQUE: Bilateral screening digital craniocaudal and mediolateral oblique
mammograms were obtained. Bilateral screening digital breast
tomosynthesis was performed. The images were evaluated with
computer-aided detection.

[R MLO synth-2D]
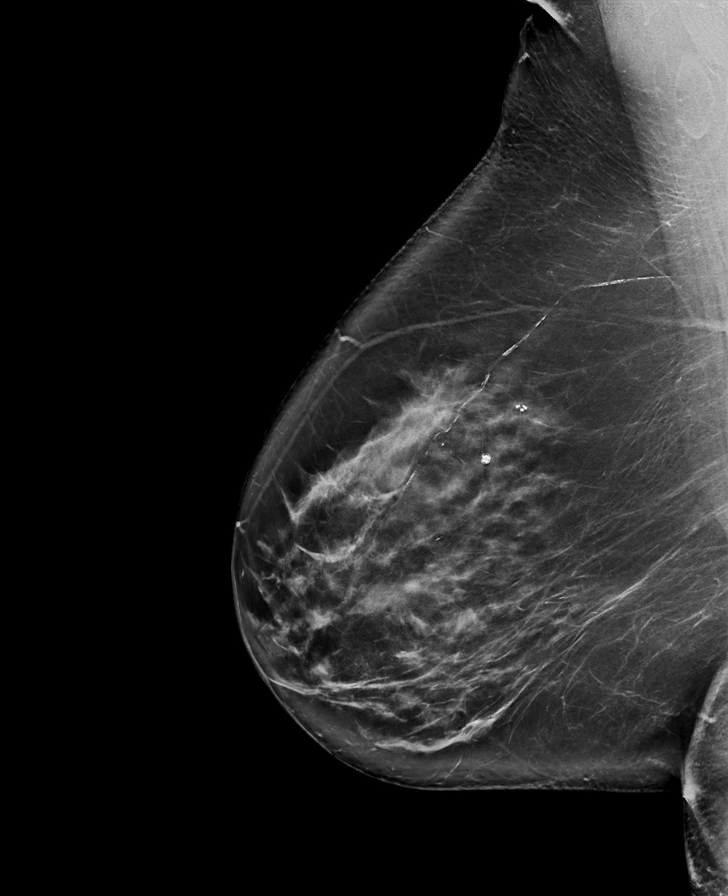

[L MLO synth-2D]
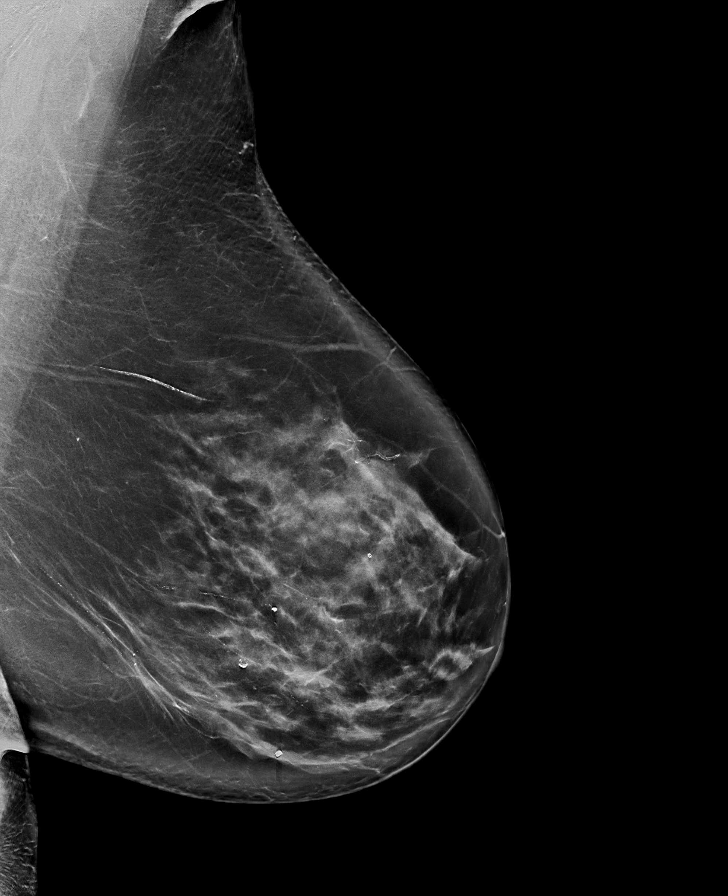

[R CC synth-2D]
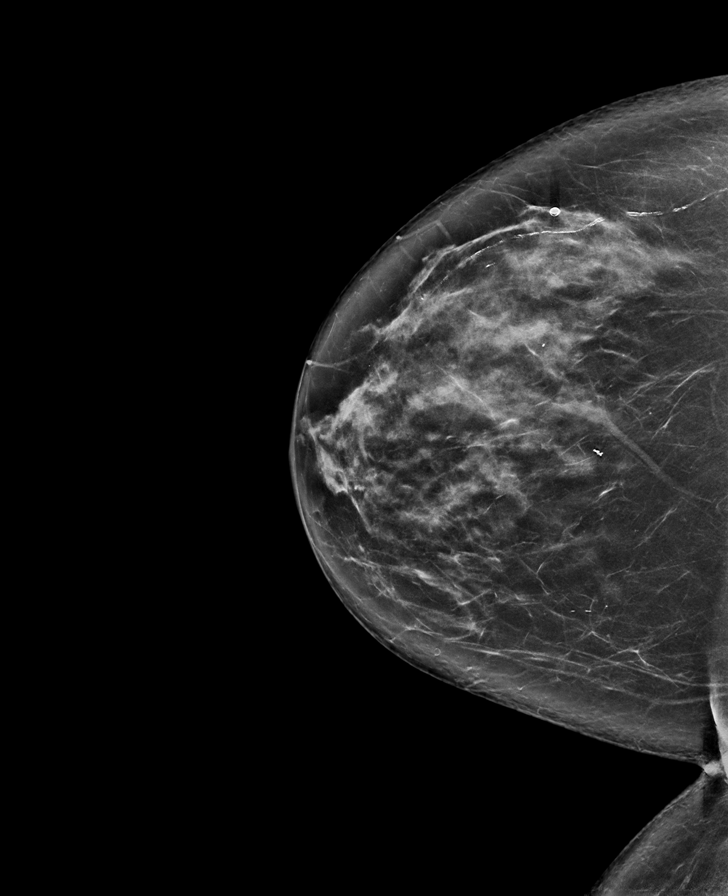

[L CC synth-2D]
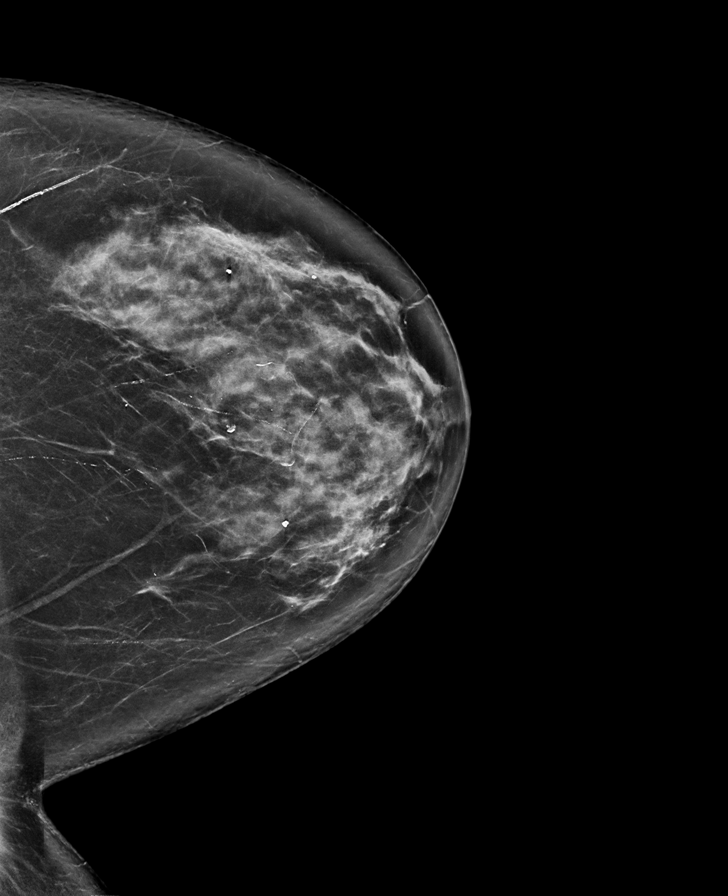

[R CC tomo · tomo slice 43/86.0]
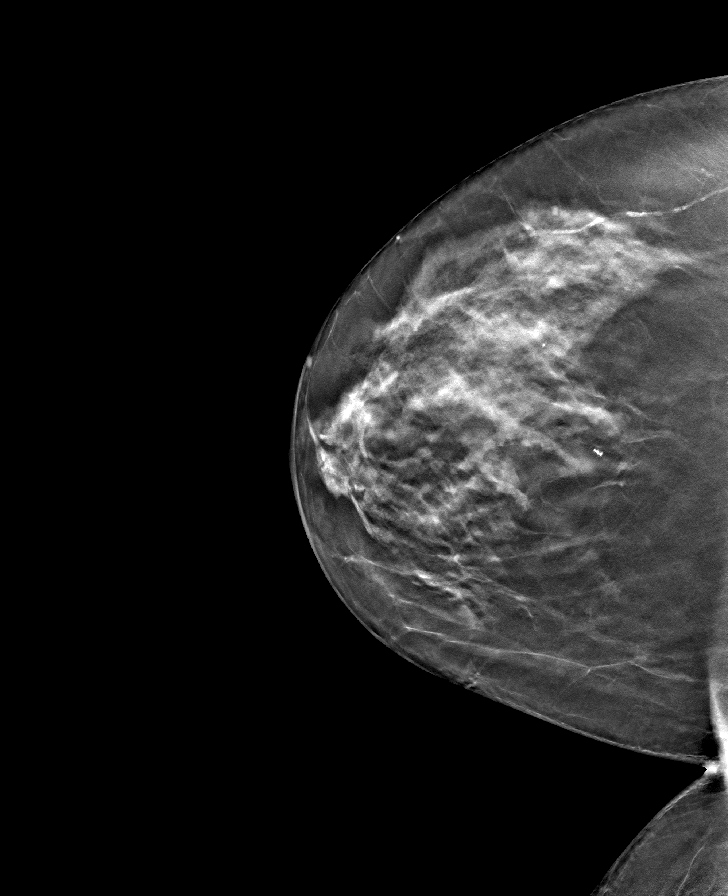

[L MLO tomo · tomo slice 51/100.0]
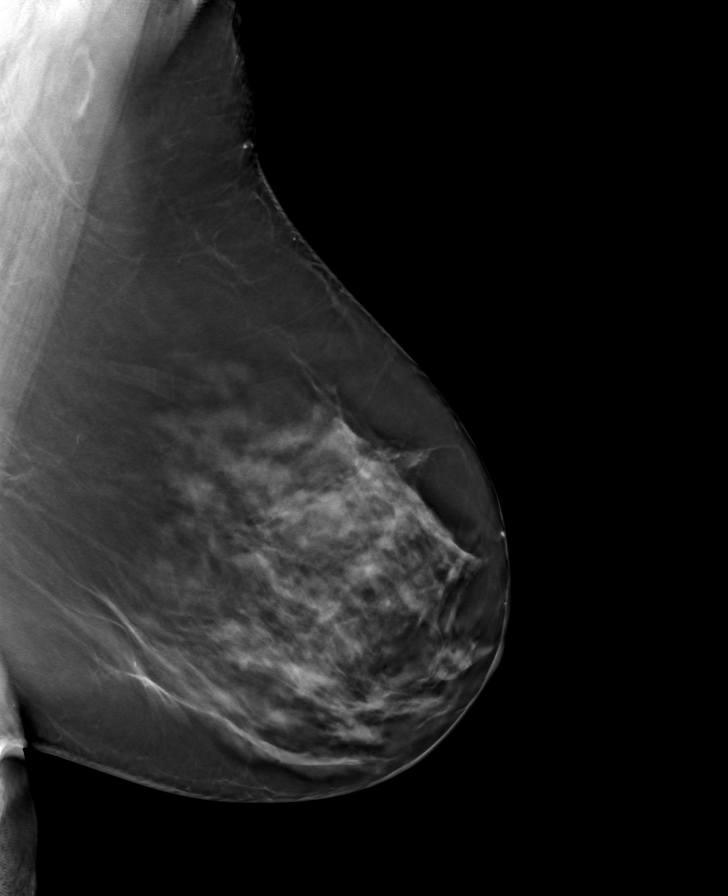

[L CC tomo · tomo slice 43/84.0]
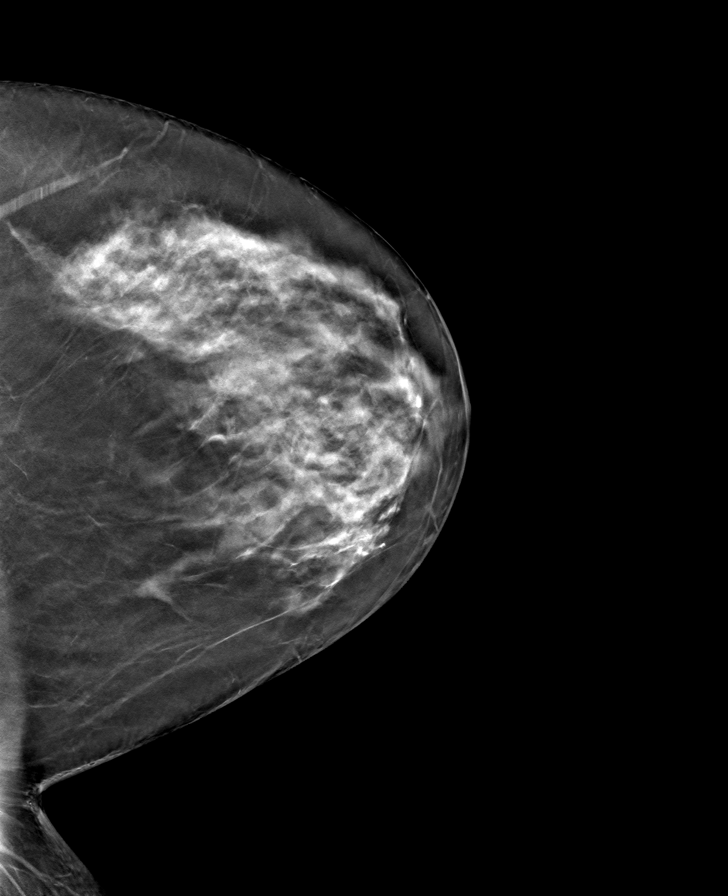

[R MLO tomo · tomo slice 49/98.0]
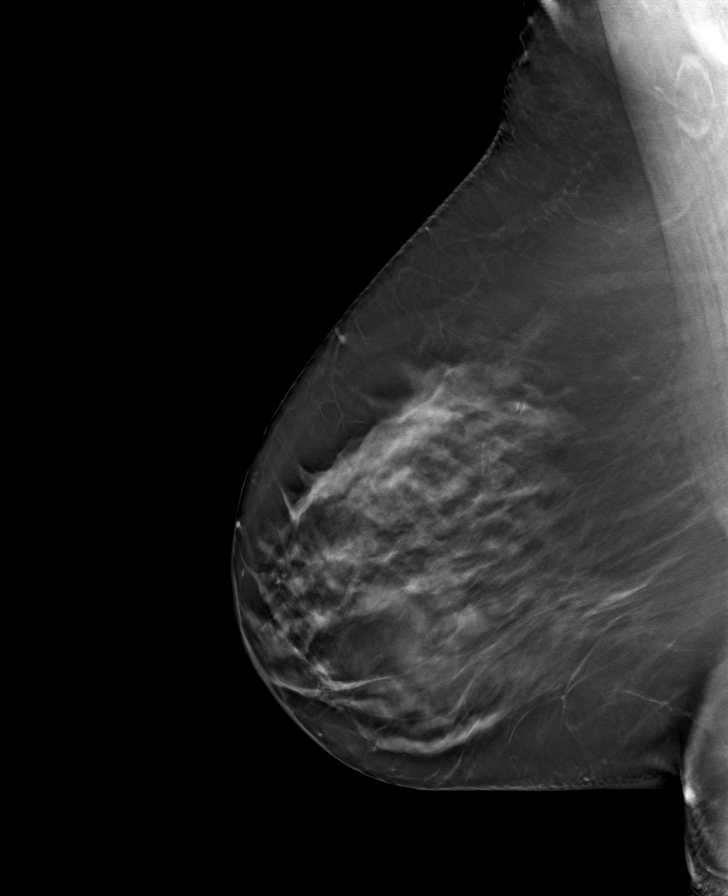

[8 of 24 positions shown; findings below may reference images not displayed]

ACR Breast Density Category c: The breast tissue is heterogeneously
dense, which may obscure small masses.
FINDINGS: There are no findings suspicious for malignancy.
IMPRESSION: No mammographic evidence of malignancy. A result letter of this
screening mammogram will be mailed directly to the patient.

RECOMMENDATION:
Screening mammogram in one year. (Code:Q3-W-BC3)

BI-RADS CATEGORY  1: Negative.

## 2022-08-05 DIAGNOSIS — H65111 Acute and subacute allergic otitis media (mucoid) (sanguinous) (serous), right ear: Secondary | ICD-10-CM | POA: Diagnosis not present

## 2022-08-26 DIAGNOSIS — H6504 Acute serous otitis media, recurrent, right ear: Secondary | ICD-10-CM | POA: Diagnosis not present

## 2022-09-04 ENCOUNTER — Ambulatory Visit
Admission: RE | Admit: 2022-09-04 | Discharge: 2022-09-04 | Disposition: A | Discharge status: Home / Self Care | Payer: BC Managed Care – PPO | Source: Ambulatory Visit | Attending: Family Medicine | Admitting: Family Medicine

## 2022-09-04 DIAGNOSIS — Z1231 Encounter for screening mammogram for malignant neoplasm of breast: Secondary | ICD-10-CM | POA: Diagnosis not present

## 2022-09-04 DIAGNOSIS — Z13820 Encounter for screening for osteoporosis: Secondary | ICD-10-CM | POA: Diagnosis present

## 2022-09-04 DIAGNOSIS — M810 Age-related osteoporosis without current pathological fracture: Secondary | ICD-10-CM | POA: Diagnosis not present

## 2022-09-04 NOTE — Progress Notes (Signed)
Decreased bone strength noted; osteoporosis. Can use Vit D 800 IU and Calcium 1200 mg to assist regular exercise for bone health. Can repeat DEXA in 3 years if desired. Recommend starting Fosamax weekly. Please let us know if you would like to start on this medication.  Gwyneth Sprout, [address] [place], [place] [address] [phone] (phone) [phone] (fax) [place]

## 2022-09-05 ENCOUNTER — Telehealth: Payer: Self-pay

## 2022-09-05 ENCOUNTER — Other Ambulatory Visit: Payer: Self-pay | Admitting: Family Medicine

## 2022-09-05 DIAGNOSIS — N6489 Other specified disorders of breast: Secondary | ICD-10-CM

## 2022-09-05 DIAGNOSIS — R928 Other abnormal and inconclusive findings on diagnostic imaging of breast: Secondary | ICD-10-CM

## 2022-09-05 MED ORDER — ALENDRONATE SODIUM 70 MG PO TABS: 70.0000 mg | ORAL_TABLET | ORAL | 11 refills | Status: DC

## 2022-09-05 NOTE — Telephone Encounter (Signed)
noted 

## 2022-09-05 NOTE — Progress Notes (Signed)
Incomplete breast cancer screening; you will be contacted for further imaging to complete full screening.  Valerie Henry, [address] [place], [place] [address] [phone] (phone) [phone] (fax) [place]

## 2022-09-05 NOTE — Telephone Encounter (Signed)
Copied from [place] [id number]. Topic: General - Other >> Sep 05, 2022  4:02 PM Leitha Schuller wrote: Pt returning call  Informed pt call was regarding imaging results, pt states she has already received results

## 2022-09-25 ENCOUNTER — Ambulatory Visit
Admission: RE | Admit: 2022-09-25 | Discharge: 2022-09-25 | Disposition: A | Discharge status: Home / Self Care | Payer: BC Managed Care – PPO | Source: Ambulatory Visit | Attending: Family Medicine | Admitting: Family Medicine

## 2022-09-25 ENCOUNTER — Telehealth: Payer: Self-pay | Admitting: *Deleted

## 2022-09-25 ENCOUNTER — Other Ambulatory Visit: Payer: Self-pay | Admitting: Family Medicine

## 2022-09-25 DIAGNOSIS — R928 Other abnormal and inconclusive findings on diagnostic imaging of breast: Secondary | ICD-10-CM | POA: Insufficient documentation

## 2022-09-25 DIAGNOSIS — N6489 Other specified disorders of breast: Secondary | ICD-10-CM | POA: Insufficient documentation

## 2022-09-25 DIAGNOSIS — R9230 Dense breasts, unspecified: Secondary | ICD-10-CM | POA: Diagnosis not present

## 2022-09-25 NOTE — Telephone Encounter (Signed)
Pt returning call, message from PCP reviewed with pt. Pt is agreeable to recommendation.    09/25/2022 12:59 PM EDT     Recommendation for R breast guided biopsy for complete screening.   Gwyneth Sprout, [address] [place], [place] [address] [phone] (phone) [phone] (fax) [place]

## 2022-09-25 NOTE — Progress Notes (Signed)
Recommendation for R breast guided biopsy for complete screening.  Valerie Henry, [address] [place], [place] [address] [phone] (phone) [phone] (fax) [place]

## 2022-10-06 ENCOUNTER — Ambulatory Visit
Admission: RE | Admit: 2022-10-06 | Discharge: 2022-10-06 | Disposition: A | Discharge status: Home / Self Care | Payer: BC Managed Care – PPO | Source: Ambulatory Visit | Attending: Family Medicine | Admitting: Family Medicine

## 2022-10-06 DIAGNOSIS — R928 Other abnormal and inconclusive findings on diagnostic imaging of breast: Secondary | ICD-10-CM

## 2022-10-06 DIAGNOSIS — N6031 Fibrosclerosis of right breast: Secondary | ICD-10-CM | POA: Diagnosis not present

## 2022-10-06 DIAGNOSIS — N6489 Other specified disorders of breast: Secondary | ICD-10-CM | POA: Insufficient documentation

## 2022-10-06 HISTORY — PX: BREAST BIOPSY: SHX20

## 2022-10-07 LAB — SURGICAL PATHOLOGY

## 2022-10-29 ENCOUNTER — Other Ambulatory Visit: Payer: Self-pay | Admitting: Family Medicine

## 2022-11-21 ENCOUNTER — Other Ambulatory Visit: Payer: Self-pay | Admitting: Family Medicine

## 2022-11-21 DIAGNOSIS — E7800 Pure hypercholesterolemia, unspecified: Secondary | ICD-10-CM

## 2023-01-23 ENCOUNTER — Encounter: Payer: Self-pay | Admitting: Physician Assistant

## 2023-01-23 ENCOUNTER — Ambulatory Visit (INDEPENDENT_AMBULATORY_CARE_PROVIDER_SITE_OTHER): Payer: BC Managed Care – PPO | Admitting: Physician Assistant

## 2023-01-23 VITALS — BP 128/75 | HR 68 | Temp 97.8°F | Resp 16 | Wt 147.0 lb

## 2023-01-23 DIAGNOSIS — J0110 Acute frontal sinusitis, unspecified: Secondary | ICD-10-CM

## 2023-01-23 MED ORDER — AMOXICILLIN 875 MG PO TABS: 875.0000 mg | ORAL_TABLET | Freq: Two times a day (BID) | ORAL | 0 refills | Status: DC

## 2023-01-23 NOTE — Progress Notes (Signed)
I,Sulibeya S Dimas,acting as a Education administrator for [employer], PA-C.,have documented all relevant documentation on the behalf of Mikey Kirschner, PA-C,as directed by  Mikey Kirschner, PA-C while in the presence of Mikey Kirschner, PA-C.     Established patient visit   Patient: Valerie Henry   DOB: [birth year]   67 y.o. Female  MRN: [id number] Visit Date: 01/23/2023  Today's healthcare provider: Mikey Kirschner, PA-C   Chief Complaint  Patient presents with   URI   Subjective    HPI  Upper respiratory symptoms She complains of right ear pressure/pain, facial pain, and sinus pressure.with no fever, chills, night sweats or weight loss. Onset of symptoms was  3 weeks ago and staying constant.She is drinking plenty of fluids.  Past history is significant for no history of pneumonia or bronchitis. Patient is non-smoker. Patient reports taking OTC Advil for pain.   ---------------------------------------------------------------------------------------------------  Medications: Outpatient Medications Prior to Visit  Medication Sig   alendronate (FOSAMAX) 70 MG tablet Take 1 tablet (70 mg total) by mouth every 7 (seven) days. Take with a full glass of water on an empty stomach. Remain sitting upright following ingestion.   Ascorbic Acid (VITAMIN C PO) Take by mouth daily.   aspirin 81 MG tablet Take 1 tablet by mouth daily. Reported on 06/06/2016   CALCIUM PO Take by mouth daily.   cetirizine (ZYRTEC) 10 MG tablet Take 1 tablet by mouth once daily   Cholecalciferol (VITAMIN D PO) Take by mouth daily.   fluticasone (FLONASE) 50 MCG/ACT nasal spray USE 2 SPRAY(S) IN EACH NOSTRIL ONCE DAILY   lisinopril-hydrochlorothiazide (ZESTORETIC) 10-12.5 MG tablet Take 1 tablet by mouth once daily   metFORMIN (GLUCOPHAGE-XR) 750 MG 24 hr tablet Take 1 tablet (750 mg total) by mouth daily with breakfast.   metoprolol succinate (TOPROL-XL) 50 MG 24 hr tablet Take 1 tablet by mouth once daily   montelukast  (SINGULAIR) 10 MG tablet Take 1 tablet (10 mg total) by mouth at bedtime.   Multiple Vitamin (MULTIVITAMIN) capsule Take 1 capsule by mouth daily.   POTASSIUM PO Take by mouth daily.   rosuvastatin (CRESTOR) 10 MG tablet Take 1 tablet by mouth once daily   No facility-administered medications prior to visit.    Review of Systems  Constitutional:  Negative for chills and fatigue.  HENT:  Positive for ear pain, sinus pressure and sinus pain.   Respiratory:  Negative for cough, chest tightness and shortness of breath.   Cardiovascular:  Negative for chest pain.  Gastrointestinal:  Negative for abdominal pain and nausea.      Objective    BP 128/75 (BP Location: Right Arm, Patient Position: Sitting, Cuff Size: Normal)   Pulse 68   Temp 97.8 F (36.6 C) (Temporal)   Resp 16   Wt 147 lb (66.7 kg)   SpO2 98%   BMI 25.23 kg/m    Physical Exam Constitutional:      General: She is awake.     Appearance: She is well-developed.  HENT:     Head: Normocephalic.     Right Ear: A middle ear effusion is present.     Left Ear: Tympanic membrane normal.     Mouth/Throat:     Pharynx: Posterior oropharyngeal erythema present. No oropharyngeal exudate.  Eyes:     Conjunctiva/sclera: Conjunctivae normal.  Cardiovascular:     Rate and Rhythm: Normal rate and regular rhythm.     Heart sounds: Normal heart sounds.  Pulmonary:     Effort:  Pulmonary effort is normal.     Breath sounds: Normal breath sounds.  Skin:    General: Skin is warm.  Neurological:     Mental Status: She is alert and oriented to person, place, and time.  Psychiatric:        Attention and Perception: Attention normal.        Mood and Affect: Mood normal.        Speech: Speech normal.        Behavior: Behavior is cooperative.     No results found for any visits on 01/23/23.  Assessment & Plan     Sinus infection Rx amoxicillin bis x 7 days Continue antihistamine and flonase Continue saline rinses   Return  if symptoms worsen or fail to improve.      I, Mikey Kirschner, PA-C have reviewed all documentation for this visit. The documentation on  01/23/23 for the exam, diagnosis, procedures, and orders are all accurate and complete.  Mikey Kirschner, PA-C [facility] [address] [place], [place], [address] Office: [phone] Fax: [place]

## 2023-01-26 ENCOUNTER — Other Ambulatory Visit: Payer: Self-pay | Admitting: Family Medicine

## 2023-01-26 DIAGNOSIS — R7303 Prediabetes: Secondary | ICD-10-CM

## 2023-01-27 ENCOUNTER — Other Ambulatory Visit: Payer: Self-pay | Admitting: Family Medicine

## 2023-02-25 DIAGNOSIS — H2513 Age-related nuclear cataract, bilateral: Secondary | ICD-10-CM | POA: Diagnosis not present

## 2023-03-18 ENCOUNTER — Other Ambulatory Visit: Payer: Self-pay | Admitting: Family Medicine

## 2023-03-18 DIAGNOSIS — N6489 Other specified disorders of breast: Secondary | ICD-10-CM

## 2023-04-09 ENCOUNTER — Other Ambulatory Visit: Payer: BC Managed Care – PPO

## 2023-04-20 ENCOUNTER — Ambulatory Visit (INDEPENDENT_AMBULATORY_CARE_PROVIDER_SITE_OTHER): Payer: Medicare Other | Admitting: Family Medicine

## 2023-04-20 VITALS — BP 146/90 | HR 66 | Temp 98.2°F | Ht 64.0 in | Wt 138.0 lb

## 2023-04-20 DIAGNOSIS — J0110 Acute frontal sinusitis, unspecified: Secondary | ICD-10-CM | POA: Diagnosis not present

## 2023-04-20 DIAGNOSIS — J301 Allergic rhinitis due to pollen: Secondary | ICD-10-CM | POA: Diagnosis not present

## 2023-04-20 MED ORDER — FLUTICASONE PROPIONATE 50 MCG/ACT NA SUSP: NASAL | 3 refills | Status: DC

## 2023-04-20 MED ORDER — AMOXICILLIN 875 MG PO TABS: 875.0000 mg | ORAL_TABLET | Freq: Two times a day (BID) | ORAL | 0 refills | Status: AC

## 2023-04-20 NOTE — Progress Notes (Signed)
I,Sha'taria Tyson,acting as a Neurosurgeon for Mila Merry, MD.,have documented all relevant documentation on the behalf of Mila Merry, MD,as directed by  Mila Merry, MD   Established patient visit   Patient: Valerie Henry   DOB: May 04, 1956   67 y.o. Female  MRN: [id number] Visit Date: 04/20/2023  Today's healthcare provider: Mila Merry, MD    Subjective    HPI  Upper respiratory symptoms She complains of right ear pressure/pain, headache described as pressure and pain in temples, nasal congestion, and sinus pressure.with no fever, chills, night sweats or weight loss. Onset of symptoms was a few weeks ago and gradually worsening.She is drinking plenty of fluids.  Past history is significant for no history of pneumonia or bronchitis. Patient is non-smoker Patient reports using nettie bottle twice a day since last visit in February. She was given abx amoxicillin at the time for same symptoms. ---------------------------------------------------------------------------------------------------   Medications: Outpatient Medications Prior to Visit  Medication Sig   alendronate (FOSAMAX) 70 MG tablet Take 1 tablet (70 mg total) by mouth every 7 (seven) days. Take with a full glass of water on an empty stomach. Remain sitting upright following ingestion.   Ascorbic Acid (VITAMIN C PO) Take by mouth daily.   aspirin 81 MG tablet Take 1 tablet by mouth daily. Reported on 06/06/2016   CALCIUM PO Take by mouth daily.   cetirizine (ZYRTEC) 10 MG tablet Take 1 tablet by mouth once daily   Cholecalciferol (VITAMIN D PO) Take by mouth daily.   fluticasone (FLONASE) 50 MCG/ACT nasal spray USE 2 SPRAY(S) IN EACH NOSTRIL ONCE DAILY   lisinopril-hydrochlorothiazide (ZESTORETIC) 10-12.5 MG tablet Take 1 tablet by mouth once daily   metFORMIN (GLUCOPHAGE-XR) 750 MG 24 hr tablet Take 1 tablet by mouth once daily with breakfast   metoprolol succinate (TOPROL-XL) 50 MG 24 hr tablet Take 1 tablet by  mouth once daily   montelukast (SINGULAIR) 10 MG tablet Take 1 tablet (10 mg total) by mouth at bedtime.   Multiple Vitamin (MULTIVITAMIN) capsule Take 1 capsule by mouth daily.   POTASSIUM PO Take by mouth daily.   rosuvastatin (CRESTOR) 10 MG tablet Take 1 tablet by mouth once daily   No facility-administered medications prior to visit.    Review of Systems  Constitutional:  Negative for appetite change, chills, fatigue and fever.  Respiratory:  Negative for chest tightness and shortness of breath.   Cardiovascular:  Negative for chest pain and palpitations.  Gastrointestinal:  Negative for abdominal pain, nausea and vomiting.  Neurological:  Negative for dizziness and weakness.       Objective    BP (!) 146/90 (BP Location: Right Arm, Patient Position: Sitting, Cuff Size: Normal)   Pulse 66   Temp 98.2 F (36.8 C) (Oral)   Ht 5\' 4"  (1.626 m)   Wt 138 lb (62.6 kg)   BMI 23.69 kg/m    Physical Exam  General Appearance:    Well developed, well nourished female, alert, cooperative, in no acute distress  HENT:   ENT exam normal, no neck nodes or sinus tenderness, bilateral TM normal without fluid or infection, neck without nodes, frontal sinuses tender, and post nasal drip noted  Eyes:    PERRL, conjunctiva/corneas clear, EOM's intact       Lungs:     Clear to auscultation bilaterally, respirations unlabored  Heart:    Normal heart rate. Normal rhythm. No murmurs, rubs, or gallops.    Neurologic:   Awake, alert, oriented  x 3. No apparent focal neurological           defect.         Assessment & Plan     1. Acute non-recurrent frontal sinusitis  - amoxicillin (AMOXIL) 875 MG tablet; Take 1 tablet (875 mg total) by mouth 2 (two) times daily for 7 days.  Dispense: 14 tablet; Refill: 0  2. Non-seasonal allergic rhinitis due to pollen Continue current allergy medications and nasal saline irrigation. Needs refill for fluticasone (FLONASE) 50 MCG/ACT nasal spray; USE 2 SPRAY(S)  IN EACH NOSTRIL ONCE DAILY  Dispense: 16 g; Refill: 3      The entirety of the information documented in the History of Present Illness, Review of Systems and Physical Exam were personally obtained by me. Portions of this information were initially documented by the CMA and reviewed by me for thoroughness and accuracy.     Mila Merry, MD  [facility] [phone] (phone) [phone] (fax)  [facility]

## 2023-04-21 ENCOUNTER — Ambulatory Visit
Admission: RE | Admit: 2023-04-21 | Discharge: 2023-04-21 | Disposition: A | Discharge status: Home / Self Care | Payer: Medicare Other | Source: Ambulatory Visit | Attending: Family Medicine | Admitting: Family Medicine

## 2023-04-21 DIAGNOSIS — N6489 Other specified disorders of breast: Secondary | ICD-10-CM

## 2023-04-21 DIAGNOSIS — R92331 Mammographic heterogeneous density, right breast: Secondary | ICD-10-CM | POA: Diagnosis not present

## 2023-04-21 NOTE — Progress Notes (Signed)
Hi Lisett  Normal mammogram; repeat in 09/2023  Please let us know if you have any questions.  Thank you,  Merita Norton, FNP

## 2023-04-23 ENCOUNTER — Other Ambulatory Visit: Payer: Self-pay | Admitting: Family Medicine

## 2023-04-24 ENCOUNTER — Telehealth: Payer: Self-pay | Admitting: Family Medicine

## 2023-04-24 DIAGNOSIS — E7800 Pure hypercholesterolemia, unspecified: Secondary | ICD-10-CM

## 2023-04-24 MED ORDER — ROSUVASTATIN CALCIUM 10 MG PO TABS: 10.0000 mg | ORAL_TABLET | Freq: Every day | ORAL | 1 refills | Status: DC

## 2023-04-24 NOTE — Telephone Encounter (Signed)
Pt is calling in because her refill simvastatin (ZOCOR) 40 MG tablet had been denied and she doesn't know. Pt would like someone to call her back to inform her on what the issue is.

## 2023-04-24 NOTE — Telephone Encounter (Signed)
Please Review. Valerie Henry refused yesterday. Patient has appointment scheduled with you 07/06/2023

## 2023-04-25 ENCOUNTER — Other Ambulatory Visit: Payer: Self-pay | Admitting: Family Medicine

## 2023-04-25 DIAGNOSIS — R7303 Prediabetes: Secondary | ICD-10-CM

## 2023-04-27 ENCOUNTER — Telehealth: Payer: Self-pay | Admitting: Family Medicine

## 2023-04-27 NOTE — Telephone Encounter (Signed)
Requested Prescriptions  Pending Prescriptions Disp Refills   metFORMIN (GLUCOPHAGE-XR) 750 MG 24 hr tablet [Pharmacy Med Name: metFORMIN HCl ER 750 MG Oral Tablet Extended Release 24 Hour] 90 tablet 0    Sig: Take 1 tablet by mouth once daily with breakfast     Endocrinology:  Diabetes - Biguanides Failed - 04/25/2023  9:33 AM      Failed - HBA1C is between 0 and 7.9 and within 180 days    Hemoglobin A1C  Date Value Ref Range Status  08/18/2014 5.7 4.2 - 6.3 % Final    Comment:    The American Diabetes Association recommends that a primary goal of therapy should be <7% and that physicians should reevaluate the treatment regimen in patients with HbA1c values consistently >8%.    Hgb A1c MFr Bld  Date Value Ref Range Status  07/03/2022 6.0 (H) 4.8 - 5.6 % Final    Comment:             Prediabetes: 5.7 - 6.4          Diabetes: >6.4          Glycemic control for adults with diabetes: <7.0          Failed - B12 Level in normal range and within 720 days    No results found for: "VITAMINB12"       Passed - Cr in normal range and within 360 days    Creatinine  Date Value Ref Range Status  08/18/2014 0.87 0.60 - 1.30 mg/dL Final   Creatinine, Ser  Date Value Ref Range Status  07/03/2022 0.94 0.57 - 1.00 mg/dL Final         Passed - eGFR in normal range and within 360 days    EGFR (African American)  Date Value Ref Range Status  08/18/2014 >60  Final   GFR calc Af Amer  Date Value Ref Range Status  06/19/2020 61 >59 mL/min/1.73 Final    Comment:    **[facility] currently reports eGFR in compliance with the current**   recommendations of the [employer]. [facility] will   update reporting as new guidelines are published from the NKF-ASN   Task force.    EGFR (Non-African Amer.)  Date Value Ref Range Status  08/18/2014 >60  Final    Comment:    eGFR values <77mL/min/1.73 m2 may be an indication of chronic kidney disease (CKD). Calculated eGFR is useful in  patients with stable renal function. The eGFR calculation will not be reliable in acutely ill patients when serum creatinine is changing rapidly. It is not useful in  patients on dialysis. The eGFR calculation may not be applicable to patients at the low and high extremes of body sizes, pregnant women, and vegetarians.    GFR calc non Af Amer  Date Value Ref Range Status  06/19/2020 53 (L) >59 mL/min/1.73 Final   eGFR  Date Value Ref Range Status  07/03/2022 67 >59 mL/min/1.73 Final         Passed - Valid encounter within last 6 months    Recent Outpatient Visits           1 week ago Non-seasonal allergic rhinitis due to pollen   [facility] Malva Limes, MD   3 months ago Acute non-recurrent frontal sinusitis   [facility] Alfredia Ferguson, PA-C   9 months ago Annual physical exam   [facility] Jacky Kindle, [place]  11 months ago Rhus dermatitis   [facility] Bivins, [place], PA-C   1 year ago Annual physical exam   [facility] Malva Limes, MD       Future Appointments             In 2 months Fisher, Demetrios Isaacs, MD [facility]            Passed - CBC within normal limits and completed in the last 12 months    WBC  Date Value Ref Range Status  07/03/2022 5.2 3.4 - 10.8 x10E3/uL Final  08/18/2014 7.4 3.6 - 11.0 x10 3/mm 3 Final   RBC  Date Value Ref Range Status  07/03/2022 4.61 3.77 - 5.28 x10E6/uL Final  08/18/2014 5.04 3.80 - 5.20 X10 6/mm 3 Final   Hemoglobin  Date Value Ref Range Status  07/03/2022 12.9 11.1 - 15.9 g/dL Final   Hematocrit  Date Value Ref Range Status  07/03/2022 39.1 34.0 - 46.6 % Final   MCHC  Date Value Ref Range Status  07/03/2022 33.0 31.5 - 35.7 g/dL Final  16/09/9603 54.0 32.0 - 36.0 g/dL Final   [facility]  Date Value Ref Range Status  07/03/2022 28.0 26.6 -  33.0 pg Final  08/18/2014 28.6 26.0 - 34.0 pg Final   MCV  Date Value Ref Range Status  07/03/2022 85 79 - 97 fL Final  08/18/2014 85 80 - 100 fL Final   No results found for: "PLTCOUNTKUC", "LABPLAT", "POCPLA" RDW  Date Value Ref Range Status  07/03/2022 13.4 11.7 - 15.4 % Final  08/18/2014 13.4 11.5 - 14.5 % Final

## 2023-04-27 NOTE — Telephone Encounter (Signed)
The patient called in stating she talked to someone on Friday regarding why her simvastatin (ZOCOR) 40 MG tablet [Pharmacy Med Name: Simvastatin 40 MG Oral Tablet] [[id number]]  was not refilled. She has been on this medication since 2015 but received the rosuvastatin (CRESTOR) 10 MG tablet last July. This medication was also refilled on Friday and is ready for the patient to pick up today. She saw Merita Norton last July in which the rosuvastatin (CRESTOR) 10 MG tablet was added. She was told her simvastatin refill was not appropriate and just wants to know why. She is ok with that if someone just tells her why and if its because she is also on the rosuvastatin (CRESTOR) 10 MG tablet  she understands but would just like to know if that is why the other would be discontinued. Please assist patient further

## 2023-04-28 NOTE — Telephone Encounter (Signed)
Patient advised and verbalized understanding 

## 2023-04-28 NOTE — Telephone Encounter (Signed)
Rosuvastatin was intended to replace simvastatin. There is no need to take both. She should only be taking rosuvastatin

## 2023-04-30 ENCOUNTER — Other Ambulatory Visit: Payer: Self-pay | Admitting: Family Medicine

## 2023-04-30 NOTE — Telephone Encounter (Signed)
Requested by interface surescripts. Future visit in 2 months. Requested Prescriptions  Pending Prescriptions Disp Refills   cetirizine (ZYRTEC) 10 MG tablet [Pharmacy Med Name: Cetirizine HCl 10 MG Oral Tablet] 90 tablet 0    Sig: Take 1 tablet by mouth once daily     Ear, Nose, and Throat:  Antihistamines 2 Passed - 04/30/2023  6:50 AM      Passed - Cr in normal range and within 360 days    Creatinine  Date Value Ref Range Status  08/18/2014 0.87 0.60 - 1.30 mg/dL Final   Creatinine, Ser  Date Value Ref Range Status  07/03/2022 0.94 0.57 - 1.00 mg/dL Final         Passed - Valid encounter within last 12 months    Recent Outpatient Visits           1 week ago Non-seasonal allergic rhinitis due to pollen   [facility] Malva Limes, MD   3 months ago Acute non-recurrent frontal sinusitis   [facility] Alfredia Ferguson, PA-C   10 months ago Annual physical exam   [facility] Jacky Kindle, FNP   11 months ago Rhus dermatitis   [facility] [place], [place], PA-C   1 year ago Annual physical exam   [facility] Malva Limes, MD       Future Appointments             In 2 months Fisher, Demetrios Isaacs, MD [facility], [facility]

## 2023-05-14 ENCOUNTER — Telehealth: Payer: Self-pay | Admitting: Family Medicine

## 2023-05-14 NOTE — Telephone Encounter (Signed)
Contacted Valerie Henry to schedule their annual wellness visit. Welcome to Medicare visit Due by 12/09/2023.  Thank you,  [facility] Direct dial  [phone]

## 2023-07-06 ENCOUNTER — Ambulatory Visit (INDEPENDENT_AMBULATORY_CARE_PROVIDER_SITE_OTHER): Payer: Medicare Other | Admitting: Family Medicine

## 2023-07-06 VITALS — BP 130/86 | HR 61 | Ht 64.0 in | Wt 136.0 lb

## 2023-07-06 DIAGNOSIS — E7800 Pure hypercholesterolemia, unspecified: Secondary | ICD-10-CM

## 2023-07-06 DIAGNOSIS — M810 Age-related osteoporosis without current pathological fracture: Secondary | ICD-10-CM

## 2023-07-06 DIAGNOSIS — R7303 Prediabetes: Secondary | ICD-10-CM | POA: Diagnosis not present

## 2023-07-06 DIAGNOSIS — Z8673 Personal history of transient ischemic attack (TIA), and cerebral infarction without residual deficits: Secondary | ICD-10-CM

## 2023-07-06 DIAGNOSIS — Z0000 Encounter for general adult medical examination without abnormal findings: Secondary | ICD-10-CM

## 2023-07-06 DIAGNOSIS — I10 Essential (primary) hypertension: Secondary | ICD-10-CM

## 2023-07-06 NOTE — Progress Notes (Signed)
Complete Physical    Patient: Valerie Henry   DOB: Sep 01, 1956   67 y.o. Female  MRN: [id number] Visit Date: 07/06/2023  Today's healthcare provider: Mila Merry, MD   Chief Complaint  Patient presents with   Annual Exam   Subjective    HPI  Here for route physical and follow up hypertension, hyperlipidemia and remote CVA history with no sequela. Prior CVA was thought to be due to acute hypertension possible related to oral decongestants which she no longer uses. Allergy and sinus congestion fairly well controlled with nasal steroids and H2 blockers. Doing well on current medications with no adverse side effects. Was started on alendronate in September of last year and reports she Is taking it consistently with no adverse effects. Reports has annual eye exams.   Medications: Outpatient Medications Prior to Visit  Medication Sig   alendronate (FOSAMAX) 70 MG tablet Take 1 tablet (70 mg total) by mouth every 7 (seven) days. Take with a full glass of water on an empty stomach. Remain sitting upright following ingestion.   Ascorbic Acid (VITAMIN C PO) Take by mouth daily.   aspirin 81 MG tablet Take 1 tablet by mouth daily. Reported on 06/06/2016   CALCIUM PO Take by mouth daily.   cetirizine (ZYRTEC) 10 MG tablet Take 1 tablet by mouth once daily   Cholecalciferol (VITAMIN D PO) Take by mouth daily.   fluticasone (FLONASE) 50 MCG/ACT nasal spray USE 2 SPRAY(S) IN EACH NOSTRIL ONCE DAILY   lisinopril-hydrochlorothiazide (ZESTORETIC) 10-12.5 MG tablet Take 1 tablet by mouth once daily   metFORMIN (GLUCOPHAGE-XR) 750 MG 24 hr tablet Take 1 tablet by mouth once daily with breakfast   metoprolol succinate (TOPROL-XL) 50 MG 24 hr tablet Take 1 tablet by mouth once daily   montelukast (SINGULAIR) 10 MG tablet Take 1 tablet (10 mg total) by mouth at bedtime.   Multiple Vitamin (MULTIVITAMIN) capsule Take 1 capsule by mouth daily.   POTASSIUM PO Take by mouth daily.   rosuvastatin  (CRESTOR) 10 MG tablet Take 1 tablet (10 mg total) by mouth daily.   No facility-administered medications prior to visit.    Review of Systems  Constitutional:  Negative for chills, diaphoresis and fever.  HENT:  Negative for congestion, ear discharge, ear pain, hearing loss, nosebleeds, sore throat and tinnitus.   Eyes:  Negative for photophobia, pain, discharge and redness.  Respiratory:  Negative for cough, shortness of breath, wheezing and stridor.   Cardiovascular:  Negative for chest pain, palpitations and leg swelling.  Gastrointestinal:  Negative for abdominal pain, blood in stool, constipation, diarrhea, nausea and vomiting.  Endocrine: Negative for polydipsia.  Genitourinary:  Negative for dysuria, flank pain, frequency, hematuria and urgency.  Musculoskeletal:  Negative for back pain, myalgias and neck pain.  Skin:  Negative for rash.  Allergic/Immunologic: Negative for environmental allergies.  Neurological:  Negative for dizziness, tremors, seizures, weakness and headaches.  Hematological:  Does not bruise/bleed easily.  Psychiatric/Behavioral:  Negative for hallucinations and suicidal ideas. The patient is not nervous/anxious.       Objective    BP 130/86 (BP Location: Left Arm, Patient Position: Sitting, Cuff Size: Normal)   Pulse 61   Ht 5\' 4"  (1.626 m)   Wt 136 lb (61.7 kg)   BMI 23.34 kg/m   Physical Exam   General Appearance:    Well developed, well nourished female. Alert, cooperative, in no acute distress, appears stated age   Head:  Normocephalic, without obvious abnormality, atraumatic  Eyes:    PERRL, conjunctiva/corneas clear, EOM's intact, fundi    benign, both eyes  Ears:    Normal TM's and external ear canals, both ears  Nose:   Nares normal, septum midline, mucosa normal, no drainage    or sinus tenderness  Throat:   Lips, mucosa, and tongue normal; teeth and gums normal  Neck:   Supple, symmetrical, trachea midline, no adenopathy;    thyroid:   no enlargement/tenderness/nodules; no carotid   bruit or JVD  Back:     Symmetric, no curvature, ROM normal, no CVA tenderness  Lungs:     Clear to auscultation bilaterally, respirations unlabored  Chest Wall:    No tenderness or deformity   Heart:    Normal heart rate. Normal rhythm. No murmurs, rubs, or gallops.   Breast Exam:    deferred  Abdomen:     Soft, non-tender, bowel sounds active all four quadrants,    no masses, no organomegaly  Pelvic:    deferred  Extremities:   All extremities are intact. No cyanosis or edema  Pulses:   2+ and symmetric all extremities  Skin:   Skin color, texture, turgor normal, no rashes or lesions  Lymph nodes:   Cervical, supraclavicular, and axillary nodes normal  Neurologic:   CNII-XII intact, normal strength, sensation and reflexes    throughout   EKG: sinus bradycardia   Assessment & Plan     1. Annual physical exam Generally doing very well with normal exam. Recommended Prevnar-20 which she declined.   2. Age related osteoporosis, unspecified pathological fracture presence Doing well on alendronate started 08/2022 - VITAMIN D 25 Hydroxy (Vit-D Deficiency, Fractures)  3. Essential (primary) hypertension Well controlled.  Continue current medications.   - EKG 12-Lead  4. Pure hypercholesterolemia She is tolerating rosuvastatin well with no adverse effects.   - Comprehensive metabolic panel - Lipid panel - CBC  5. Cerebrovascular accident, old   32. Prediabetes  - HgB A1c         Mila Merry, MD  [facility] [phone] (phone) [phone] (fax)  [facility]

## 2023-07-06 NOTE — Progress Notes (Signed)
Annual Wellness Visit     Patient: Valerie Henry, Female    DOB: 05-29-1956, 67 y.o.   MRN: [id number] Visit Date: 07/06/2023  Today's Provider: Mila Merry, MD    Subjective    Valerie Henry is a 67 y.o. female who presents today for her Annual Wellness Visit.   Medications: Outpatient Medications Prior to Visit  Medication Sig   alendronate (FOSAMAX) 70 MG tablet Take 1 tablet (70 mg total) by mouth every 7 (seven) days. Take with a full glass of water on an empty stomach. Remain sitting upright following ingestion.   Ascorbic Acid (VITAMIN C PO) Take by mouth daily.   aspirin 81 MG tablet Take 1 tablet by mouth daily. Reported on 06/06/2016   CALCIUM PO Take by mouth daily.   cetirizine (ZYRTEC) 10 MG tablet Take 1 tablet by mouth once daily   Cholecalciferol (VITAMIN D PO) Take by mouth daily.   fluticasone (FLONASE) 50 MCG/ACT nasal spray USE 2 SPRAY(S) IN EACH NOSTRIL ONCE DAILY   lisinopril-hydrochlorothiazide (ZESTORETIC) 10-12.5 MG tablet Take 1 tablet by mouth once daily   metFORMIN (GLUCOPHAGE-XR) 750 MG 24 hr tablet Take 1 tablet by mouth once daily with breakfast   metoprolol succinate (TOPROL-XL) 50 MG 24 hr tablet Take 1 tablet by mouth once daily   montelukast (SINGULAIR) 10 MG tablet Take 1 tablet (10 mg total) by mouth at bedtime.   Multiple Vitamin (MULTIVITAMIN) capsule Take 1 capsule by mouth daily.   POTASSIUM PO Take by mouth daily.   rosuvastatin (CRESTOR) 10 MG tablet Take 1 tablet (10 mg total) by mouth daily.   No facility-administered medications prior to visit.    Allergies  Allergen Reactions   Lactose Intolerance (Gi)     GI upset    Patient Care Team: Malva Limes, MD as PCP - General (Family Medicine)      Objective     Most recent functional status assessment:    01/23/2023    4:08 PM  In your present state of health, do you have any difficulty performing the following activities:  Hearing? 0  Vision? 0   Difficulty concentrating or making decisions? 0  Walking or climbing stairs? 0  Dressing or bathing? 0  Doing errands, shopping? 0   Most recent fall risk assessment:    07/06/2023    8:32 AM  Fall Risk   Falls in the past year? 0  Injury with Fall? 0  Risk for fall due to : No Fall Risks  Follow up Falls evaluation completed    Most recent depression screenings:    07/06/2023    8:32 AM 01/23/2023    4:07 PM  PHQ 2/9 Scores  PHQ - 2 Score 0 0  PHQ- 9 Score  0   Most recent cognitive screening:     No data to display         Most recent Audit-C alcohol use screening    01/23/2023    4:08 PM  Alcohol Use Disorder Test (AUDIT)  1. How often do you have a drink containing alcohol? 2  2. How many drinks containing alcohol do you have on a typical day when you are drinking? 0  3. How often do you have six or more drinks on one occasion? 0  AUDIT-C Score 2   A score of 3 or more in women, and 4 or more in men indicates increased risk for alcohol abuse, EXCEPT if all of the points  are from question 1   No results found for any visits on 07/06/23.  Assessment & Plan     Annual wellness visit done today including the all of the following: Reviewed patient's Family Medical History Reviewed and updated list of patient's medical providers Assessment of cognitive impairment was done Assessed patient's functional ability Established a written schedule for health screening services Health Risk Assessent Completed and Reviewed  Exercise Activities and Dietary recommendations  Goals   None     Immunization History  Administered Date(s) Administered   Tdap 05/28/2015   Zoster, Live 05/24/2016    Health Maintenance  Topic Date Due   Zoster Vaccines- Shingrix (1 of 2) 02/04/2006   Pneumonia Vaccine 23+ Years old (1 of 1 - PCV) Never done   COVID-19 Vaccine (1 - 2023-24 season) Never done   INFLUENZA VACCINE  07/09/2023   Medicare Annual Wellness (AWV)  07/05/2024    MAMMOGRAM  09/25/2024   DTaP/Tdap/Td (2 - Td or Tdap) 05/27/2025   Colonoscopy  07/03/2026   DEXA SCAN  Completed   Hepatitis C Screening  Completed   HPV VACCINES  Aged Out     Discussed health benefits of physical activity, and encouraged her to engage in regular exercise appropriate for her age and condition.           Mila Merry, MD  [facility] [phone] (phone) [phone] (fax)  [facility]

## 2023-07-06 NOTE — Patient Instructions (Signed)
Please review the attached list of medications and notify my office if there are any errors.  ° °I recommend that you get the Prevnar 20 vaccine to protect yourself from certain dangerous strains of pneumonia. You can get Prevnar 20 at your pharmacy, or call our office at [phone] at your earliest convenience to schedule this vaccine.   °

## 2023-07-19 ENCOUNTER — Other Ambulatory Visit: Payer: Self-pay | Admitting: Family Medicine

## 2023-07-21 NOTE — Telephone Encounter (Signed)
Requested Prescriptions  Pending Prescriptions Disp Refills   metoprolol succinate (TOPROL-XL) 50 MG 24 hr tablet [Pharmacy Med Name: Metoprolol Succinate ER 50 MG Oral Tablet Extended Release 24 Hour] 90 tablet 3    Sig: Take 1 tablet by mouth once daily     Cardiovascular:  Beta Blockers Passed - 07/19/2023  7:50 AM      Passed - Last BP in normal range    BP Readings from Last 1 Encounters:  07/06/23 130/86         Passed - Last Heart Rate in normal range    Pulse Readings from Last 1 Encounters:  07/06/23 61         Passed - Valid encounter within last 6 months    Recent Outpatient Visits           2 weeks ago Annual physical exam   [place] [facility] Malva Limes, MD   3 months ago Non-seasonal allergic rhinitis due to pollen   [facility] [place] [facility] Malva Limes, MD   5 months ago Acute non-recurrent frontal sinusitis   [place] [facility] Alfredia Ferguson, PA-C   1 year ago Annual physical exam   [place] [facility] Merita Norton T, FNP   1 year ago Rhus dermatitis   [place] [facility] [place], [place], PA-C       Future Appointments             In 11 months Fisher, Demetrios Isaacs, MD [facility]             lisinopril-hydrochlorothiazide (ZESTORETIC) 10-12.5 MG tablet [Pharmacy Med Name: Lisinopril-hydroCHLOROthiazide 10-12.5 MG Oral Tablet] 90 tablet 3    Sig: Take 1 tablet by mouth once daily     Cardiovascular:  ACEI + Diuretic Combos Passed - 07/19/2023  7:50 AM      Passed - Na in normal range and within 180 days    Sodium  Date Value Ref Range Status  07/06/2023 139 134 - 144 mmol/L Final  08/18/2014 143 136 - 145 mmol/L Final         Passed - K in normal range and within 180 days    Potassium  Date Value Ref Range Status  07/06/2023 4.4 3.5 - 5.2 mmol/L Final  08/18/2014 4.0 3.5 - 5.1 mmol/L Final         Passed - Cr in  normal range and within 180 days    Creatinine  Date Value Ref Range Status  08/18/2014 0.87 0.60 - 1.30 mg/dL Final   Creatinine, Ser  Date Value Ref Range Status  07/06/2023 0.88 0.57 - 1.00 mg/dL Final         Passed - eGFR is 30 or above and within 180 days    EGFR (African American)  Date Value Ref Range Status  08/18/2014 >60  Final   GFR calc Af Amer  Date Value Ref Range Status  06/19/2020 61 >59 mL/min/1.73 Final    Comment:    **[facility] currently reports eGFR in compliance with the current**   recommendations of the [employer]. [facility] will   update reporting as new guidelines are published from the NKF-ASN   Task force.    EGFR (Non-African Amer.)  Date Value Ref Range Status  08/18/2014 >60  Final    Comment:    eGFR values <63mL/min/1.73 m2 may be an indication of chronic kidney disease (CKD). Calculated eGFR  is useful in patients with stable renal function. The eGFR calculation will not be reliable in acutely ill patients when serum creatinine is changing rapidly. It is not useful in  patients on dialysis. The eGFR calculation may not be applicable to patients at the low and high extremes of body sizes, pregnant women, and vegetarians.    GFR calc non Af Amer  Date Value Ref Range Status  06/19/2020 53 (L) >59 mL/min/1.73 Final   eGFR  Date Value Ref Range Status  07/06/2023 72 >59 mL/min/1.73 Final         Passed - Patient is not pregnant      Passed - Last BP in normal range    BP Readings from Last 1 Encounters:  07/06/23 130/86         Passed - Valid encounter within last 6 months    Recent Outpatient Visits           2 weeks ago Annual physical exam   [place] [facility] Malva Limes, MD   3 months ago Non-seasonal allergic rhinitis due to pollen   [facility] Malva Limes, MD   5 months ago Acute non-recurrent frontal sinusitis   [facility] Alfredia Ferguson, PA-C   1 year ago Annual physical exam   [facility] Jacky Kindle, FNP   1 year ago Rhus dermatitis   [place] [facility] [place], [place], PA-C       Future Appointments             In 11 months Fisher, Demetrios Isaacs, MD [facility], [facility]

## 2023-07-23 ENCOUNTER — Other Ambulatory Visit: Payer: Self-pay | Admitting: Family Medicine

## 2023-07-24 ENCOUNTER — Other Ambulatory Visit: Payer: Self-pay | Admitting: Family Medicine

## 2023-07-24 DIAGNOSIS — R7303 Prediabetes: Secondary | ICD-10-CM

## 2023-07-29 ENCOUNTER — Other Ambulatory Visit: Payer: Self-pay | Admitting: Family Medicine

## 2023-07-29 NOTE — Telephone Encounter (Signed)
Prescription was sent in 07/24/2023 to [facility] pharmacy.

## 2023-08-31 ENCOUNTER — Other Ambulatory Visit: Payer: Self-pay | Admitting: Family Medicine

## 2023-08-31 ENCOUNTER — Telehealth: Payer: Self-pay | Admitting: Family Medicine

## 2023-08-31 DIAGNOSIS — R928 Other abnormal and inconclusive findings on diagnostic imaging of breast: Secondary | ICD-10-CM

## 2023-08-31 NOTE — Telephone Encounter (Signed)
Pt is calling in requesting for Dr. Sherrie Mustache to put in an order for a 6 month mammogram for her.

## 2023-09-29 ENCOUNTER — Ambulatory Visit
Admission: RE | Admit: 2023-09-29 | Discharge: 2023-09-29 | Disposition: A | Discharge status: Home / Self Care | Payer: Medicare Other | Source: Ambulatory Visit | Attending: Family Medicine | Admitting: Family Medicine

## 2023-09-29 DIAGNOSIS — R92333 Mammographic heterogeneous density, bilateral breasts: Secondary | ICD-10-CM | POA: Diagnosis not present

## 2023-09-29 DIAGNOSIS — R928 Other abnormal and inconclusive findings on diagnostic imaging of breast: Secondary | ICD-10-CM | POA: Diagnosis not present

## 2023-10-10 ENCOUNTER — Other Ambulatory Visit: Payer: Self-pay | Admitting: Family Medicine

## 2023-10-12 NOTE — Telephone Encounter (Signed)
Requested medication (s) are due for refill today: yes  Requested medication (s) are on the active medication list: yes  Last refill:  07/23/23 #12  Future visit scheduled:yes  Notes to clinic:  overdue lab work   Requested Prescriptions  Pending Prescriptions Disp Refills   alendronate (FOSAMAX) 70 MG tablet [Pharmacy Med Name: Alendronate Sodium 70 MG Oral Tablet] 12 tablet 0    Sig: TAKE 1 TABLET BY MOUTH ONCE A WEEK ON AN EMPTY STOMACH WITH  A  FULL  GLASS  OF  WATER.  REMAIN  SITTING  UPRIGHT  FOLLOWING  INGESTION.     Endocrinology:  Bisphosphonates Failed - 10/10/2023  9:11 AM      Failed - Mg Level in normal range and within 360 days    No results found for: "MG"       Failed - Phosphate in normal range and within 360 days    Phosphorus  Date Value Ref Range Status  05/28/2015 4.0 2.5 - 4.5 mg/dL Final         Passed - Ca in normal range and within 360 days    Calcium  Date Value Ref Range Status  07/06/2023 9.4 8.7 - 10.3 mg/dL Final   Calcium, Total  Date Value Ref Range Status  08/18/2014 8.5 8.5 - 10.1 mg/dL Final         Passed - Vitamin D in normal range and within 360 days    Vit D, 25-Hydroxy  Date Value Ref Range Status  07/06/2023 82.5 30.0 - 100.0 ng/mL Final    Comment:    Vitamin D deficiency has been defined by the Institute of Medicine and an Endocrine Society practice guideline as a level of serum 25-OH vitamin D less than 20 ng/mL (1,2). The Endocrine Society went on to further define vitamin D insufficiency as a level between 21 and 29 ng/mL (2). 1. IOM (Institute of Medicine). 2010. Dietary reference    intakes for calcium and D. [place]: The    [employer]. 2. Holick MF, Binkley Eaton, Bischoff-Ferrari HA, et al.    Evaluation, treatment, and prevention of vitamin D    deficiency: an Endocrine Society clinical practice    guideline. JCEM. 2011 Jul; 96(7):1911-30.          Passed - Cr in normal range and within 360 days     Creatinine  Date Value Ref Range Status  08/18/2014 0.87 0.60 - 1.30 mg/dL Final   Creatinine, Ser  Date Value Ref Range Status  07/06/2023 0.88 0.57 - 1.00 mg/dL Final         Passed - eGFR is 30 or above and within 360 days    EGFR (African American)  Date Value Ref Range Status  08/18/2014 >60  Final   GFR calc Af Amer  Date Value Ref Range Status  06/19/2020 61 >59 mL/min/1.73 Final    Comment:    **[facility] currently reports eGFR in compliance with the current**   recommendations of the [employer]. [facility] will   update reporting as new guidelines are published from the NKF-ASN   Task force.    EGFR (Non-African Amer.)  Date Value Ref Range Status  08/18/2014 >60  Final    Comment:    eGFR values <64mL/min/1.73 m2 may be an indication of chronic kidney disease (CKD). Calculated eGFR is useful in patients with stable renal function. The eGFR calculation will not be reliable in acutely ill patients when serum creatinine is changing  rapidly. It is not useful in  patients on dialysis. The eGFR calculation may not be applicable to patients at the low and high extremes of body sizes, pregnant women, and vegetarians.    GFR calc non Af Amer  Date Value Ref Range Status  06/19/2020 53 (L) >59 mL/min/1.73 Final   eGFR  Date Value Ref Range Status  07/06/2023 72 >59 mL/min/1.73 Final         Passed - Valid encounter within last 12 months    Recent Outpatient Visits           3 months ago Annual physical exam   [place] [facility] Malva Limes, MD   5 months ago Non-seasonal allergic rhinitis due to pollen   [facility] Malva Limes, MD   8 months ago Acute non-recurrent frontal sinusitis   [facility] Alfredia Ferguson, PA-C   1 year ago Annual physical exam   [facility] Jacky Kindle, FNP   1 year ago Rhus dermatitis   [place]  [facility] [place], [place], PA-C       Future Appointments             In 9 months Fisher, Demetrios Isaacs, MD [facility]            Passed - Bone Mineral Density or Dexa Scan completed in the last 2 years

## 2023-10-17 ENCOUNTER — Other Ambulatory Visit: Payer: Self-pay | Admitting: Family Medicine

## 2023-10-17 DIAGNOSIS — E7800 Pure hypercholesterolemia, unspecified: Secondary | ICD-10-CM

## 2023-10-19 ENCOUNTER — Other Ambulatory Visit: Payer: Self-pay | Admitting: Family Medicine

## 2023-10-19 DIAGNOSIS — R7303 Prediabetes: Secondary | ICD-10-CM

## 2023-10-20 ENCOUNTER — Other Ambulatory Visit: Payer: Self-pay | Admitting: Family Medicine

## 2023-10-20 NOTE — Telephone Encounter (Signed)
Requested Prescriptions  Pending Prescriptions Disp Refills   metFORMIN (GLUCOPHAGE-XR) 750 MG 24 hr tablet [Pharmacy Med Name: metFORMIN HCl ER 750 MG Oral Tablet Extended Release 24 Hour] 90 tablet 0    Sig: Take 1 tablet by mouth once daily with breakfast     Endocrinology:  Diabetes - Biguanides Failed - 10/19/2023  9:18 AM      Failed - B12 Level in normal range and within 720 days    No results found for: "VITAMINB12"       Failed - CBC within normal limits and completed in the last 12 months    WBC  Date Value Ref Range Status  07/06/2023 6.1 3.4 - 10.8 x10E3/uL Final  08/18/2014 7.4 3.6 - 11.0 x10 3/mm 3 Final   RBC  Date Value Ref Range Status  07/06/2023 4.71 3.77 - 5.28 x10E6/uL Final  08/18/2014 5.04 3.80 - 5.20 X10 6/mm 3 Final   Hemoglobin  Date Value Ref Range Status  07/06/2023 13.2 11.1 - 15.9 g/dL Final   Hematocrit  Date Value Ref Range Status  07/06/2023 40.8 34.0 - 46.6 % Final   MCHC  Date Value Ref Range Status  07/06/2023 32.4 31.5 - 35.7 g/dL Final  44/12/270 53.6 32.0 - 36.0 g/dL Final   [facility]  Date Value Ref Range Status  07/06/2023 28.0 26.6 - 33.0 pg Final  08/18/2014 28.6 26.0 - 34.0 pg Final   MCV  Date Value Ref Range Status  07/06/2023 87 79 - 97 fL Final  08/18/2014 85 80 - 100 fL Final   No results found for: "PLTCOUNTKUC", "LABPLAT", "POCPLA" RDW  Date Value Ref Range Status  07/06/2023 13.3 11.7 - 15.4 % Final  08/18/2014 13.4 11.5 - 14.5 % Final         Passed - Cr in normal range and within 360 days    Creatinine  Date Value Ref Range Status  08/18/2014 0.87 0.60 - 1.30 mg/dL Final   Creatinine, Ser  Date Value Ref Range Status  07/06/2023 0.88 0.57 - 1.00 mg/dL Final         Passed - HBA1C is between 0 and 7.9 and within 180 days    Hemoglobin A1C  Date Value Ref Range Status  08/18/2014 5.7 4.2 - 6.3 % Final    Comment:    The American Diabetes Association recommends that a primary goal of therapy should be  <7% and that physicians should reevaluate the treatment regimen in patients with HbA1c values consistently >8%.    Hgb A1c MFr Bld  Date Value Ref Range Status  07/06/2023 5.7 (H) 4.8 - 5.6 % Final    Comment:             Prediabetes: 5.7 - 6.4          Diabetes: >6.4          Glycemic control for adults with diabetes: <7.0          Passed - eGFR in normal range and within 360 days    EGFR (African American)  Date Value Ref Range Status  08/18/2014 >60  Final   GFR calc Af Amer  Date Value Ref Range Status  06/19/2020 61 >59 mL/min/1.73 Final    Comment:    **[facility] currently reports eGFR in compliance with the current**   recommendations of the [employer]. [facility] will   update reporting as new guidelines are published from the NKF-ASN   Task force.    EGFR (Non-African  Denyse Dago.)  Date Value Ref Range Status  08/18/2014 >60  Final    Comment:    eGFR values <67mL/min/1.73 m2 may be an indication of chronic kidney disease (CKD). Calculated eGFR is useful in patients with stable renal function. The eGFR calculation will not be reliable in acutely ill patients when serum creatinine is changing rapidly. It is not useful in  patients on dialysis. The eGFR calculation may not be applicable to patients at the low and high extremes of body sizes, pregnant women, and vegetarians.    GFR calc non Af Amer  Date Value Ref Range Status  06/19/2020 53 (L) >59 mL/min/1.73 Final   eGFR  Date Value Ref Range Status  07/06/2023 72 >59 mL/min/1.73 Final         Passed - Valid encounter within last 6 months    Recent Outpatient Visits           3 months ago Annual physical exam   [place] [facility] Malva Limes, MD   6 months ago Non-seasonal allergic rhinitis due to pollen   [facility] Malva Limes, MD   9 months ago Acute non-recurrent frontal sinusitis   [facility] Alfredia Ferguson, PA-C   1 year ago Annual physical exam   [facility] Jacky Kindle, FNP   1 year ago Rhus dermatitis   [place] [facility] [place], [place], PA-C       Future Appointments             In 8 months Fisher, Demetrios Isaacs, MD [facility], [facility]

## 2024-01-15 ENCOUNTER — Other Ambulatory Visit: Payer: Self-pay | Admitting: Family Medicine

## 2024-01-15 DIAGNOSIS — R7303 Prediabetes: Secondary | ICD-10-CM

## 2024-01-20 ENCOUNTER — Ambulatory Visit (INDEPENDENT_AMBULATORY_CARE_PROVIDER_SITE_OTHER): Payer: Medicare Other | Admitting: Family Medicine

## 2024-01-20 ENCOUNTER — Encounter: Payer: Self-pay | Admitting: Family Medicine

## 2024-01-20 VITALS — BP 150/81 | HR 83 | Resp 16 | Ht 64.0 in | Wt 136.0 lb

## 2024-01-20 DIAGNOSIS — J0110 Acute frontal sinusitis, unspecified: Secondary | ICD-10-CM

## 2024-01-20 MED ORDER — IPRATROPIUM BROMIDE 0.03 % NA SOLN: 2.0000 | Freq: Two times a day (BID) | NASAL | 0 refills | Status: DC

## 2024-01-20 MED ORDER — HYDROCOD POLI-CHLORPHE POLI ER 10-8 MG/5ML PO SUER: 5.0000 mL | Freq: Two times a day (BID) | ORAL | 0 refills | Status: DC | PRN

## 2024-01-20 MED ORDER — AMOXICILLIN-POT CLAVULANATE 875-125 MG PO TABS: 1.0000 | ORAL_TABLET | Freq: Two times a day (BID) | ORAL | 0 refills | Status: DC

## 2024-01-20 NOTE — Progress Notes (Signed)
Established patient visit   Patient: Valerie Henry   DOB: 1956-08-15   68 y.o. Female  MRN: [id number] Visit Date: 01/20/2024  Today's healthcare provider: Sherlyn Hay, DO   Chief Complaint  Patient presents with   Cough    Began 9 days ago, no fevers noted   Sore Throat        Nasal Congestion   Subjective    Cough Associated symptoms include ear pain (intermittent, sharp, right side) and a sore throat (r/t cough). Pertinent negatives include no chest pain, chills, fever or shortness of breath.  Sore Throat  Associated symptoms include coughing and ear pain (intermittent, sharp, right side). Pertinent negatives include no congestion, diarrhea, shortness of breath or vomiting.   Valerie Henry is a 69 year old female with high blood pressure who presents with head congestion and cough for nine days.  She has been experiencing head congestion and a cough for the past nine days. Initially, her symptoms worsened, then improved, but have recently worsened again. No fever or chills.  She has a sore throat, which she attributes to coughing, and experiences sinus pain and pressure, particularly when leaning forward. She also has headaches and occasional sharp right ear pain, which she believes is related to sinus pressure. No nausea, vomiting, or diarrhea.  She has been using Coricidin for her symptoms due to her high blood pressure and Flonase for nasal congestion, though she feels Flonase has not been effective. She uses regular strength Tylenol and ibuprofen as needed for sinus pain and inflammation. She has not been using a cool mist humidifier despite owning one.  She is concerned about being contagious as she plans to keep her six-year-old granddaughter on Saturday.     Medications: Outpatient Medications Prior to Visit  Medication Sig   alendronate (FOSAMAX) 70 MG tablet TAKE 1 TABLET BY MOUTH ONCE A WEEK ON AN EMPTY STOMACH WITH  A  FULL  GLASS  OF  WATER.   REMAIN  SITTING  UPRIGHT  FOLLOWING  INGESTION.   Ascorbic Acid (VITAMIN C PO) Take by mouth daily.   aspirin 81 MG tablet Take 1 tablet by mouth daily. Reported on 06/06/2016   CALCIUM PO Take by mouth daily.   cetirizine (ZYRTEC) 10 MG tablet Take 1 tablet by mouth once daily   Cholecalciferol (VITAMIN D PO) Take by mouth daily.   fluticasone (FLONASE) 50 MCG/ACT nasal spray USE 2 SPRAY(S) IN EACH NOSTRIL ONCE DAILY   lisinopril-hydrochlorothiazide (ZESTORETIC) 10-12.5 MG tablet Take 1 tablet by mouth once daily   metFORMIN (GLUCOPHAGE-XR) 750 MG 24 hr tablet Take 1 tablet by mouth once daily with breakfast   metoprolol succinate (TOPROL-XL) 50 MG 24 hr tablet Take 1 tablet by mouth once daily   Multiple Vitamin (MULTIVITAMIN) capsule Take 1 capsule by mouth daily.   POTASSIUM PO Take by mouth daily.   rosuvastatin (CRESTOR) 10 MG tablet Take 1 tablet by mouth once daily   montelukast (SINGULAIR) 10 MG tablet Take 1 tablet (10 mg total) by mouth at bedtime. (Patient not taking: Reported on 01/20/2024)   No facility-administered medications prior to visit.    Review of Systems  Constitutional:  Negative for chills and fever.  HENT:  Positive for ear pain (intermittent, sharp, right side), sinus pressure, sinus pain and sore throat (r/t cough). Negative for congestion.   Respiratory:  Positive for cough. Negative for shortness of breath.   Cardiovascular:  Negative for chest pain.  Gastrointestinal:  Negative for diarrhea and vomiting.        Objective    BP (!) 150/81   Pulse 83   Resp 16   Ht 5\' 4"  (1.626 m)   Wt 136 lb (61.7 kg)   SpO2 100%   BMI 23.34 kg/m     Physical Exam Vitals reviewed.  Constitutional:      General: She is not in acute distress.    Appearance: Normal appearance. She is well-developed. She is not diaphoretic.  HENT:     Head: Normocephalic and atraumatic.     Right Ear: Tympanic membrane, ear canal and external ear normal.     Left Ear: Tympanic  membrane, ear canal and external ear normal.     Nose: Nose normal.     Mouth/Throat:     Mouth: Mucous membranes are moist.     Pharynx: Oropharynx is clear. No oropharyngeal exudate.  Eyes:     General: No scleral icterus.    Conjunctiva/sclera: Conjunctivae normal.     Pupils: Pupils are equal, round, and reactive to light.  Cardiovascular:     Rate and Rhythm: Normal rate and regular rhythm.     Pulses: Normal pulses.     Heart sounds: Normal heart sounds. No murmur heard. Pulmonary:     Effort: Pulmonary effort is normal. No respiratory distress.     Breath sounds: Normal breath sounds. No wheezing or rales.  Musculoskeletal:     Cervical back: Neck supple.     Right lower leg: No edema.     Left lower leg: No edema.  Lymphadenopathy:     Cervical: No cervical adenopathy.  Skin:    General: Skin is warm and dry.     Findings: No rash.  Neurological:     Mental Status: She is alert.      No results found for any visits on 01/20/24.  Assessment & Plan    Acute non-recurrent frontal sinusitis -     Amoxicillin-Pot Clavulanate; Take 1 tablet by mouth 2 (two) times daily.  Dispense: 14 tablet; Refill: 0 -     Ipratropium Bromide; Place 2 sprays into both nostrils every 12 (twelve) hours.  Dispense: 30 mL; Refill: 0 -     Hydrocod Poli-Chlorphe Poli ER; Take 5 mLs by mouth every 12 (twelve) hours as needed for cough.  Dispense: 115 mL; Refill: 0   Presents with head congestion, cough, and sinus pressure for nine days. Symptoms initially improved but then worsened. No fever, chills, nausea, vomiting, or diarrhea. Some sore throat related to coughing. Intermittent sharp ear pain likely related to sinus pressure. Nasal congestion not relieved by Flonase. Given the duration and severity of symptoms, bacterial sinusitis is suspected. Discussed antibiotics for sinusitis, including potential benefits and risks such as antibiotic resistance. Discussed alternative symptomatic treatments  including ibuprofen and acetaminophen for pain and inflammation, and the use of a cool mist humidifier. Advised on potential contagiousness and wearing a mask around her granddaughter. - Prescribe antibiotic for sinusitis - Recommend ipratropium nasal spray in place of Flonase while sick - Advise ibuprofen or acetaminophen for symptomatic relief - Encourage use of a cool mist humidifier - Advise wearing a mask around her granddaughter   No follow-ups on file.      I discussed the assessment and treatment plan with the patient  The patient was provided an opportunity to ask questions and all were answered. The patient agreed with the plan and demonstrated an understanding of the  instructions.   The patient was advised to call back or seek an in-person evaluation if the symptoms worsen or if the condition fails to improve as anticipated.    Sherlyn Hay, DO  [facility] [phone] (phone) [phone] (fax)  [facility]

## 2024-04-13 ENCOUNTER — Other Ambulatory Visit: Payer: Self-pay | Admitting: Family Medicine

## 2024-04-13 DIAGNOSIS — R7303 Prediabetes: Secondary | ICD-10-CM

## 2024-07-08 ENCOUNTER — Ambulatory Visit: Payer: Self-pay | Admitting: Family Medicine

## 2024-07-08 ENCOUNTER — Encounter: Payer: Self-pay | Admitting: Family Medicine

## 2024-07-08 VITALS — BP 144/83 | HR 60 | Temp 98.3°F | Ht 64.0 in | Wt 135.1 lb

## 2024-07-08 DIAGNOSIS — M810 Age-related osteoporosis without current pathological fracture: Secondary | ICD-10-CM | POA: Diagnosis not present

## 2024-07-08 DIAGNOSIS — E7800 Pure hypercholesterolemia, unspecified: Secondary | ICD-10-CM

## 2024-07-08 DIAGNOSIS — Z0001 Encounter for general adult medical examination with abnormal findings: Secondary | ICD-10-CM | POA: Diagnosis not present

## 2024-07-08 DIAGNOSIS — Z0000 Encounter for general adult medical examination without abnormal findings: Secondary | ICD-10-CM

## 2024-07-08 DIAGNOSIS — I10 Essential (primary) hypertension: Secondary | ICD-10-CM | POA: Diagnosis not present

## 2024-07-08 NOTE — Progress Notes (Signed)
 Annual Wellness Visit     Patient: Valerie Henry, Female    DOB: Oct 08, 1956, 68 y.o.   MRN: [id number] Visit Date: 07/08/2024  Today's Provider: Nancyann Perry, MD   Chief Complaint  Patient presents with   Annual Exam    Diet: Avoids fried foods, otherwise normal Exercise: 5x week, biking for about 30-45 mins Sleep: Well Overall Feeling: Well Patient declines breast exam- will do mammo instead Declines prevnar at this time, states she received older shingles vaccine    Subjective      Medications: Outpatient Medications Prior to Visit  Medication Sig   alendronate  (FOSAMAX ) 70 MG tablet TAKE 1 TABLET BY MOUTH ONCE A WEEK ON AN EMPTY STOMACH WITH  A  FULL  GLASS  OF  WATER .  REMAIN  SITTING  UPRIGHT  FOLLOWING  INGESTION.   Ascorbic Acid (VITAMIN C PO) Take by mouth daily.   aspirin 81 MG tablet Take 1 tablet by mouth daily. Reported on 06/06/2016   CALCIUM  PO Take by mouth daily.   cetirizine  (ZYRTEC ) 10 MG tablet Take 1 tablet by mouth once daily   Cholecalciferol (VITAMIN D  PO) Take by mouth daily.   fluticasone  (FLONASE ) 50 MCG/ACT nasal spray USE 2 SPRAY(S) IN EACH NOSTRIL ONCE DAILY   ipratropium (ATROVENT ) 0.03 % nasal spray Place 2 sprays into both nostrils every 12 (twelve) hours.   lisinopril -hydrochlorothiazide  (ZESTORETIC ) 10-12.5 MG tablet Take 1 tablet by mouth once daily   metFORMIN  (GLUCOPHAGE -XR) 750 MG 24 hr tablet Take 1 tablet by mouth once daily with breakfast   metoprolol  succinate (TOPROL -XL) 50 MG 24 hr tablet Take 1 tablet by mouth once daily   Multiple Vitamin (MULTIVITAMIN) capsule Take 1 capsule by mouth daily.   POTASSIUM PO Take by mouth daily.   rosuvastatin  (CRESTOR ) 10 MG tablet Take 1 tablet by mouth once daily   montelukast  (SINGULAIR ) 10 MG tablet Take 1 tablet (10 mg total) by mouth at bedtime. (Patient not taking: Reported on 07/08/2024)   [DISCONTINUED] amoxicillin -clavulanate (AUGMENTIN ) 875-125 MG tablet Take 1 tablet by mouth 2  (two) times daily.   [DISCONTINUED] chlorpheniramine-HYDROcodone (TUSSIONEX) 10-8 MG/5ML Take 5 mLs by mouth every 12 (twelve) hours as needed for cough.   No facility-administered medications prior to visit.    Allergies  Allergen Reactions   Lactose Intolerance (Gi)     GI upset    Patient Care Team: Perry Nancyann BRAVO, MD as PCP - General (Family Medicine)    Objective     Most recent functional status assessment:    07/08/2024    9:52 AM  In your present state of health, do you have any difficulty performing the following activities:  Hearing? 0  Difficulty concentrating or making decisions? 0  Walking or climbing stairs? 0  Dressing or bathing? 0  Doing errands, shopping? 0   Most recent fall risk assessment:    07/08/2024    8:33 AM  Fall Risk   Falls in the past year? 0  Number falls in past yr: 0  Injury with Fall? 0  Risk for fall due to : No Fall Risks  Follow up Falls evaluation completed    Most recent depression screenings:    07/08/2024    8:33 AM 01/20/2024    9:42 AM  PHQ 2/9 Scores  PHQ - 2 Score 0 0  PHQ- 9 Score 0 1   Most recent cognitive screening:    07/08/2024    9:52 AM  6CIT Screen  What Year? 0 points  What month? 0 points  What time? 0 points  Count back from 20 0 points  Months in reverse 0 points  Repeat phrase 0 points  Total Score 0 points   Most recent Audit-C alcohol use screening    07/08/2024    8:26 AM  Alcohol Use Disorder Test (AUDIT)  1. How often do you have a drink containing alcohol? 2  2. How many drinks containing alcohol do you have on a typical day when you are drinking? 0  3. How often do you have six or more drinks on one occasion? 0  AUDIT-C Score 2   A score of 3 or more in women, and 4 or more in men indicates increased risk for alcohol abuse, EXCEPT if all of the points are from question 1   No results found for any visits on 07/08/24.  Assessment & Plan     Annual wellness visit done today including  the all of the following: Reviewed patient's Family Medical History Reviewed and updated list of patient's medical providers Assessment of cognitive impairment was done Assessed patient's functional ability Established a written schedule for health screening services Health Risk Assessent Completed and Reviewed  Exercise Activities and Dietary recommendations  Goals   None     Immunization History  Administered Date(s) Administered   Tdap 05/28/2015   Zoster, Live 05/24/2016    Health Maintenance  Topic Date Due   Medicare Annual Wellness (AWV)  07/05/2024   INFLUENZA VACCINE  07/08/2024   COVID-19 Vaccine (1 - 2024-25 season) 07/24/2024 (Originally 08/09/2023)   Zoster Vaccines- Shingrix (1 of 2) 10/08/2024 (Originally 02/04/2006)   Pneumococcal Vaccine: 50+ Years (1 of 1 - PCV) 07/08/2025 (Originally 02/04/2006)   DEXA SCAN  09/04/2024   DTaP/Tdap/Td (2 - Td or Tdap) 05/27/2025   MAMMOGRAM  09/28/2025   Colonoscopy  07/03/2026   Hepatitis C Screening  Completed   Hepatitis B Vaccines  Aged Out   HPV VACCINES  Aged Out   Meningococcal B Vaccine  Aged Out     Discussed health benefits of physical activity, and encouraged her to engage in regular exercise appropriate for her age and condition.         Nancyann Perry, MD  [facility] [phone] (phone) [phone] (fax)  [facility]

## 2024-07-08 NOTE — Progress Notes (Addendum)
 Complete physical exam   Patient: Valerie Henry   DOB: 1956-09-23   68 y.o. Female  MRN: [id number] Visit Date: 07/08/2024  Today's healthcare provider: Nancyann Perry, MD   Chief Complaint  Patient presents with   Annual Exam    Diet: Avoids fried foods, otherwise normal Exercise: 5x week, biking for about 30-45 mins Sleep: Well Overall Feeling: Well Patient declines breast exam- will do mammo instead Declines prevnar at this time, states she received older shingles vaccine    Subjective    Discussed the use of AI scribe software for clinical note transcription with the patient, who gave verbal consent to proceed.  History of Present Illness   Valerie Henry is a 68 year old female who presents for her annual physical exam.  She manages her allergies and sinus issues with acupuncture, nasal sprays, and Zyrtec , which she takes year-round. She reports significant improvement with these treatments.  She is on rosuvastatin  for cholesterol management without any issues. Her home blood pressure readings are normal, with recent measurements of 128/85 mmHg and 119/79 mmHg.  She remains physically active by riding a stationary bike and works part-time. She has not had any ear infections this year.  Her last mammogram in August showed a spot that required further investigation, including an ultrasound and biopsy, which were negative. She is scheduled for a follow-up mammogram in October.  She had a colonoscopy in 2017 with no polyps found and is due for another in two years. No stomach problems or changes in bowel habits.  She drinks alcohol occasionally and has never smoked. She has not noticed any stomach problems, changes in bowel habits, or blood in her stool.     Lab Results  Component Value Date   CHOL 175 07/06/2023   HDL 76 07/06/2023   LDLCALC 85 07/06/2023   TRIG 75 07/06/2023   CHOLHDL 2.3 07/06/2023   Lab Results  Component Value Date   NA 139 07/06/2023    K 4.4 07/06/2023   CREATININE 0.88 07/06/2023   EGFR 72 07/06/2023   GLUCOSE 94 07/06/2023     Past Medical History:  Diagnosis Date   Allergy    Arthritis    thumbs   Chicken pox    Measles    Mumps    Stroke (HCC) 08/17/2014   No deficits   Past Surgical History:  Procedure Laterality Date   BREAST BIOPSY Right 10/06/2022   stereo bx, distortion, X clip-benign   COLONOSCOPY WITH PROPOFOL  N/A 07/03/2016   Procedure: COLONOSCOPY WITH PROPOFOL ;  Surgeon: Rogelia Copping, MD;  Location: [facility];  Service: Endoscopy;  Laterality: N/A;   TUBAL LIGATION  80's   Social History   Socioeconomic History   Marital status: Married    Spouse name: Not on file   Number of children: 2   Years of education: HS Grad   Highest education level: Not on file  Occupational History   Not on file  Tobacco Use   Smoking status: Never   Smokeless tobacco: Never  Vaping Use   Vaping status: Never Used  Substance and Sexual Activity   Alcohol use: Yes    Alcohol/week: 4.0 standard drinks of alcohol    Types: 4 Standard drinks or equivalent per week    Comment: 1 glass of wine on weekends   Drug use: No   Sexual activity: Not on file  Other Topics Concern   Not on file  Social History Narrative  Not on file   Social Drivers of Health   Financial Resource Strain: Low Risk  (07/08/2024)   Overall Financial Resource Strain (CARDIA)    Difficulty of Paying Living Expenses: Not hard at all  Food Insecurity: No Food Insecurity (07/08/2024)   Hunger Vital Sign    Worried About Running Out of Food in the Last Year: Never true    Ran Out of Food in the Last Year: Never true  Transportation Needs: No Transportation Needs (07/08/2024)   PRAPARE - Administrator, Civil Service (Medical): No    Lack of Transportation (Non-Medical): No  Physical Activity: Sufficiently Active (07/08/2024)   Exercise Vital Sign    Days of Exercise per Week: 5 days    Minutes of Exercise per  Session: 40 min  Stress: No Stress Concern Present (07/08/2024)   [employer] of Occupational Health - Occupational Stress Questionnaire    Feeling of Stress: Not at all  Social Connections: Moderately Integrated (07/08/2024)   Social Connection and Isolation Panel    Frequency of Communication with Friends and Family: More than three times a week    Frequency of Social Gatherings with Friends and Family: Twice a week    Attends Religious Services: More than 4 times per year    Active Member of [employer] or Organizations: No    Attends Banker Meetings: Never    Marital Status: Married  Catering manager Violence: Not At Risk (07/08/2024)   Humiliation, Afraid, Rape, and Kick questionnaire    Fear of Current or Ex-Partner: No    Emotionally Abused: No    Physically Abused: No    Sexually Abused: No   Family Status  Relation Name Status   Mother Valerie  Henry Deceased       Ovarian cancer   Father  Deceased at age 15       MI   Brother  Alive       In good health   Daughter  Alive       In good health   Son  Alive       In good health, adopted   Nutritional therapist  Alive   Neg Hx  (Not Specified)  No partnership data on file   Family History  Problem Relation Age of Onset   Ovarian cancer Mother        late 61's   Cancer Mother    Heart attack Father    Dementia Father    Heart attack Paternal Uncle    Breast cancer Neg Hx    Allergies  Allergen Reactions   Lactose Intolerance (Gi)     GI upset    Patient Care Team: Gasper Nancyann BRAVO, MD as PCP - General (Family Medicine)   Medications: Outpatient Medications Prior to Visit  Medication Sig   alendronate  (FOSAMAX ) 70 MG tablet TAKE 1 TABLET BY MOUTH ONCE A WEEK ON AN EMPTY STOMACH WITH  A  FULL  GLASS  OF  WATER .  REMAIN  SITTING  UPRIGHT  FOLLOWING  INGESTION.   Ascorbic Acid (VITAMIN C PO) Take by mouth daily.   aspirin 81 MG tablet Take 1 tablet by mouth daily. Reported on 06/06/2016   CALCIUM  PO Take by mouth  daily.   cetirizine  (ZYRTEC ) 10 MG tablet Take 1 tablet by mouth once daily   Cholecalciferol (VITAMIN D  PO) Take by mouth daily.   fluticasone  (FLONASE ) 50 MCG/ACT nasal spray USE 2 SPRAY(S) IN EACH NOSTRIL ONCE DAILY  ipratropium (ATROVENT ) 0.03 % nasal spray Place 2 sprays into both nostrils every 12 (twelve) hours.   lisinopril -hydrochlorothiazide  (ZESTORETIC ) 10-12.5 MG tablet Take 1 tablet by mouth once daily   metFORMIN  (GLUCOPHAGE -XR) 750 MG 24 hr tablet Take 1 tablet by mouth once daily with breakfast   metoprolol  succinate (TOPROL -XL) 50 MG 24 hr tablet Take 1 tablet by mouth once daily   Multiple Vitamin (MULTIVITAMIN) capsule Take 1 capsule by mouth daily.   POTASSIUM PO Take by mouth daily.   rosuvastatin  (CRESTOR ) 10 MG tablet Take 1 tablet by mouth once daily   montelukast  (SINGULAIR ) 10 MG tablet Take 1 tablet (10 mg total) by mouth at bedtime. (Patient not taking: Reported on 07/08/2024)   No facility-administered medications prior to visit.    Review of Systems  Constitutional:  Negative for appetite change, chills, fatigue and fever.  Respiratory:  Negative for chest tightness and shortness of breath.   Cardiovascular:  Negative for chest pain and palpitations.  Gastrointestinal:  Negative for abdominal pain, nausea and vomiting.  Neurological:  Negative for dizziness and weakness.      Objective    BP (!) 144/83 (BP Location: Left Arm, Patient Position: Sitting)   Pulse 60   Temp 98.3 F (36.8 C) (Oral)   Ht 5' 4 (1.626 m)   Wt 135 lb 1.6 oz (61.3 kg)   SpO2 100%   BMI 23.19 kg/m    Physical Exam   General Appearance:    Well developed, well nourished female. Alert, cooperative, in no acute distress, appears stated age   Head:    Normocephalic, without obvious abnormality, atraumatic  Eyes:    PERRL, conjunctiva/corneas clear, EOM's intact, fundi    benign, both eyes  Ears:    Normal TM's and external ear canals, both ears  Nose:   Nares normal, septum  midline, mucosa normal, no drainage    or sinus tenderness  Throat:   Lips, mucosa, and tongue normal; teeth and gums normal  Neck:   Supple, symmetrical, trachea midline, no adenopathy;    thyroid:  no enlargement/tenderness/nodules; no carotid   bruit or JVD  Back:     Symmetric, no curvature, ROM normal, no CVA tenderness  Lungs:     Clear to auscultation bilaterally, respirations unlabored  Chest Wall:    No tenderness or deformity   Heart:    Normal heart rate. Normal rhythm. No murmurs, rubs, or gallops.   Breast Exam:    deferred  Abdomen:     Soft, non-tender, bowel sounds active all four quadrants,    no masses, no organomegaly  Pelvic:    deferred  Extremities:   All extremities are intact. No cyanosis or edema  Pulses:   2+ and symmetric all extremities  Skin:   Skin color, texture, turgor normal, no rashes or lesions  Lymph nodes:   Cervical, supraclavicular, and axillary nodes normal  Neurologic:   CNII-XII intact, normal strength, sensation and reflexes    throughout       Assessment & Plan    Routine Health Maintenance and Physical Exam  Exercise Activities and Dietary recommendations  Goals   None     Immunization History  Administered Date(s) Administered   Tdap 05/28/2015   Zoster, Live 05/24/2016    Health Maintenance  Topic Date Due   Medicare Annual Wellness (AWV)  07/05/2024   INFLUENZA VACCINE  07/08/2024   COVID-19 Vaccine (1 - 2024-25 season) 07/24/2024 (Originally 08/09/2023)   Zoster Vaccines- Shingrix (1  of 2) 10/08/2024 (Originally 02/04/2006)   Pneumococcal Vaccine: 50+ Years (1 of 1 - PCV) 07/08/2025 (Originally 02/04/2006)   DTaP/Tdap/Td (2 - Td or Tdap) 05/27/2025   MAMMOGRAM  09/28/2025   Colonoscopy  07/03/2026   DEXA SCAN  Completed   Hepatitis C Screening  Completed   Hepatitis B Vaccines  Aged Out   HPV VACCINES  Aged Out   Meningococcal B Vaccine  Aged Out    Discussed health benefits of physical activity, and encouraged her  to engage in regular exercise appropriate for her age and condition.  Recommended pneumococcal and shingles vaccines which she declined.   - CBC w/Diff/Platelet - Comprehensive Metabolic Panel (CMET) - Lipid panel  2. Pure hypercholesterolemia She is tolerating rosuvastatin  well with no adverse effects.    3. Essential (primary) hypertension Well controlled.  Continue current medications.    4. Age related osteoporosis, unspecified pathological fracture presence         Nancyann Perry, MD  [facility] [phone] (phone) [phone] (fax)  [facility]

## 2024-07-09 LAB — CBC WITH DIFFERENTIAL/PLATELET
Basophils Absolute: 0.1 x10E3/uL (ref 0.0–0.2)
Basos: 1 %
EOS (ABSOLUTE): 0.3 x10E3/uL (ref 0.0–0.4)
Eos: 6 %
Hematocrit: 39.2 % (ref 34.0–46.6)
Hemoglobin: 12.6 g/dL (ref 11.1–15.9)
Immature Grans (Abs): 0 x10E3/uL (ref 0.0–0.1)
Immature Granulocytes: 0 %
Lymphocytes Absolute: 2 x10E3/uL (ref 0.7–3.1)
Lymphs: 35 %
MCH: 28.9 pg (ref 26.6–33.0)
MCHC: 32.1 g/dL (ref 31.5–35.7)
MCV: 90 fL (ref 79–97)
Monocytes Absolute: 0.4 x10E3/uL (ref 0.1–0.9)
Monocytes: 7 %
Neutrophils Absolute: 2.9 x10E3/uL (ref 1.4–7.0)
Neutrophils: 51 %
Platelets: 301 x10E3/uL (ref 150–450)
RBC: 4.36 x10E6/uL (ref 3.77–5.28)
RDW: 12.9 % (ref 11.7–15.4)
WBC: 5.6 x10E3/uL (ref 3.4–10.8)

## 2024-07-09 LAB — COMPREHENSIVE METABOLIC PANEL WITH GFR
ALT: 21 IU/L (ref 0–32)
AST: 27 IU/L (ref 0–40)
Albumin: 4.3 g/dL (ref 3.9–4.9)
Alkaline Phosphatase: 70 IU/L (ref 44–121)
BUN/Creatinine Ratio: 27 (ref 12–28)
BUN: 26 mg/dL (ref 8–27)
Bilirubin Total: 0.3 mg/dL (ref 0.0–1.2)
CO2: 21 mmol/L (ref 20–29)
Calcium: 9.7 mg/dL (ref 8.7–10.3)
Chloride: 104 mmol/L (ref 96–106)
Creatinine, Ser: 0.97 mg/dL (ref 0.57–1.00)
Globulin, Total: 2.1 g/dL (ref 1.5–4.5)
Glucose: 86 mg/dL (ref 70–99)
Potassium: 4.4 mmol/L (ref 3.5–5.2)
Sodium: 141 mmol/L (ref 134–144)
Total Protein: 6.4 g/dL (ref 6.0–8.5)
eGFR: 64 mL/min/1.73 (ref >59)

## 2024-07-09 LAB — LIPID PANEL
Chol/HDL Ratio: 2.2 ratio (ref 0.0–4.4)
Cholesterol, Total: 162 mg/dL (ref 100–199)
HDL: 74 mg/dL (ref >39)
LDL Chol Calc (NIH): 76 mg/dL (ref 0–99)
Triglycerides: 63 mg/dL (ref 0–149)
VLDL Cholesterol Cal: 12 mg/dL (ref 5–40)

## 2024-07-10 ENCOUNTER — Ambulatory Visit: Payer: Self-pay | Admitting: Family Medicine

## 2024-07-11 ENCOUNTER — Other Ambulatory Visit: Payer: Self-pay | Admitting: Family Medicine

## 2024-07-11 ENCOUNTER — Encounter: Payer: Self-pay | Admitting: Family Medicine

## 2024-07-11 DIAGNOSIS — R7303 Prediabetes: Secondary | ICD-10-CM

## 2024-07-12 ENCOUNTER — Other Ambulatory Visit: Payer: Self-pay | Admitting: Family Medicine

## 2024-07-12 DIAGNOSIS — J0110 Acute frontal sinusitis, unspecified: Secondary | ICD-10-CM

## 2024-08-17 ENCOUNTER — Other Ambulatory Visit: Payer: Self-pay | Admitting: Family Medicine

## 2024-08-17 DIAGNOSIS — N6031 Fibrosclerosis of right breast: Secondary | ICD-10-CM

## 2024-08-17 DIAGNOSIS — R928 Other abnormal and inconclusive findings on diagnostic imaging of breast: Secondary | ICD-10-CM

## 2024-08-17 DIAGNOSIS — Z1231 Encounter for screening mammogram for malignant neoplasm of breast: Secondary | ICD-10-CM

## 2024-10-04 ENCOUNTER — Ambulatory Visit
Admission: RE | Admit: 2024-10-04 | Discharge: 2024-10-04 | Disposition: A | Discharge status: Home / Self Care | Source: Ambulatory Visit | Attending: Family Medicine | Admitting: Family Medicine

## 2024-10-04 DIAGNOSIS — Z1231 Encounter for screening mammogram for malignant neoplasm of breast: Secondary | ICD-10-CM | POA: Insufficient documentation

## 2024-10-04 DIAGNOSIS — N6031 Fibrosclerosis of right breast: Secondary | ICD-10-CM | POA: Insufficient documentation

## 2024-10-04 DIAGNOSIS — R928 Other abnormal and inconclusive findings on diagnostic imaging of breast: Secondary | ICD-10-CM | POA: Insufficient documentation

## 2024-10-07 ENCOUNTER — Other Ambulatory Visit: Payer: Self-pay | Admitting: Family Medicine

## 2024-10-25 ENCOUNTER — Other Ambulatory Visit: Payer: Self-pay | Admitting: Family Medicine

## 2024-10-25 ENCOUNTER — Telehealth: Payer: Self-pay

## 2024-10-25 DIAGNOSIS — M542 Cervicalgia: Secondary | ICD-10-CM

## 2024-10-25 DIAGNOSIS — J301 Allergic rhinitis due to pollen: Secondary | ICD-10-CM

## 2024-10-25 NOTE — Telephone Encounter (Signed)
 Copied from CRM [id number]. Topic: Clinical - Medication Refill >> Oct 25, 2024  9:20 AM Antwanette L wrote: Medication: fluticasone  (FLONASE ) 50 MCG/ACT nasal spray  Has the patient contacted their pharmacy? No  This is the patient's preferred pharmacy:  [facility] [address]), [place] - [address] [place]) [place] [address] Phone: [phone] Fax: [phone]  Is this the correct pharmacy for this prescription? Yes   Has the prescription been filled recently? No. Last refill was on 04/20/23  Is the patient out of the medication? Yes  Has the patient been seen for an appointment in the last year OR does the patient have an upcoming appointment? Yes. Last ov with Dr. Gasper was on 07/08/24 and next appt is on 07/10/25  Can we respond through MyChart? Yes and by phone at [phone]  Agent: Please be advised that Rx refills may take up to 3 business days. We ask that you follow-up with your pharmacy.

## 2024-10-25 NOTE — Telephone Encounter (Signed)
 Copied from CRM [id number]. Topic: Clinical - Medication Question >> Oct 25, 2024  9:16 AM Antwanette L wrote: Reason for CRM: The patient is calling to ask if Dr. Gasper can prescribe a muscle relaxer for nighttime use. She currently sees a chiropractor for neck issues and reports tightness in her neck related to her job. The patient has no current symptoms. She can be reached by phone at [phone] or via MyChart. Her preferred pharmacy is [facility], located at [address] [place], [place] [address].

## 2024-10-26 MED ORDER — METHOCARBAMOL 500 MG PO TABS: 500.0000 mg | ORAL_TABLET | Freq: Every evening | ORAL | 1 refills | Status: AC | PRN

## 2024-10-26 NOTE — Addendum Note (Signed)
 Addended by: GASPER NANCYANN BRAVO on: 10/26/2024 04:35 PM   Modules accepted: Orders

## 2024-10-27 NOTE — Telephone Encounter (Signed)
 Requested medication (s) are due for refill today - expired Rx  Requested medication (s) are on the active medication list -yes  Future visit scheduled -yes  Last refill: 04/20/23 16g 3RF  Notes to clinic: expired Rx- sent for review   Requested Prescriptions  Pending Prescriptions Disp Refills   fluticasone  (FLONASE ) 50 MCG/ACT nasal spray 16 g 3    Sig: USE 2 SPRAY(S) IN EACH NOSTRIL ONCE DAILY     Ear, Nose, and Throat: Nasal Preparations - Corticosteroids Passed - 10/27/2024  3:44 PM      Passed - Valid encounter within last 12 months    Recent Outpatient Visits           3 months ago Annual physical exam   [facility] Gasper Nancyann BRAVO, MD   9 months ago Acute non-recurrent frontal sinusitis   [facility] [place], Lauraine SAILOR, DO       Future Appointments             In 8 months Fisher, Nancyann BRAVO, MD [facility], [place]               Requested Prescriptions  Pending Prescriptions Disp Refills   fluticasone  (FLONASE ) 50 MCG/ACT nasal spray 16 g 3    Sig: USE 2 SPRAY(S) IN EACH NOSTRIL ONCE DAILY     Ear, Nose, and Throat: Nasal Preparations - Corticosteroids Passed - 10/27/2024  3:44 PM      Passed - Valid encounter within last 12 months    Recent Outpatient Visits           3 months ago Annual physical exam   [facility] Gasper Nancyann BRAVO, MD   9 months ago Acute non-recurrent frontal sinusitis   [facility] [place], Lauraine SAILOR, DO       Future Appointments             In 8 months Fisher, Nancyann BRAVO, MD [facility], [place]

## 2024-10-28 MED ORDER — FLUTICASONE PROPIONATE 50 MCG/ACT NA SUSP
NASAL | 3 refills | Status: AC
Start: 2024-10-28 — End: ?

## 2024-11-04 ENCOUNTER — Telehealth: Payer: Self-pay

## 2024-11-08 NOTE — Telephone Encounter (Signed)
 Please disregard the previous message. All your chronic conditions were covered this year by your PCP.  Please plan to be seen as you were instructed at your last visit.  [facility]

## 2024-11-27 ENCOUNTER — Other Ambulatory Visit: Payer: Self-pay | Admitting: Family Medicine

## 2025-01-03 ENCOUNTER — Other Ambulatory Visit: Payer: Self-pay | Admitting: Family Medicine

## 2025-01-03 DIAGNOSIS — E7800 Pure hypercholesterolemia, unspecified: Secondary | ICD-10-CM

## 2025-07-10 ENCOUNTER — Encounter: Admitting: Family Medicine
